# Patient Record
Sex: Male | Born: 1995 | Race: White | Hispanic: No | Marital: Single | Smoking: Never smoker
Health system: Southern US, Community
[De-identification: ages and names within clinical notes are randomized; demographics above are authoritative.]

## PROBLEM LIST (undated history)

## (undated) DIAGNOSIS — E109 Type 1 diabetes mellitus without complications: Secondary | ICD-10-CM

## (undated) HISTORY — PX: OTHER SURGICAL HISTORY: SHX169

---

## 2016-09-04 ENCOUNTER — Inpatient Hospital Stay
Admission: EM | Admit: 2016-09-04 | Discharge: 2016-09-07 | DRG: 638 | Disposition: A | Discharge status: Home / Self Care | Payer: Self-pay | Attending: Internal Medicine | Admitting: Internal Medicine

## 2016-09-04 ENCOUNTER — Encounter: Payer: Self-pay | Admitting: Emergency Medicine

## 2016-09-04 ENCOUNTER — Emergency Department: Payer: Self-pay

## 2016-09-04 DIAGNOSIS — E860 Dehydration: Secondary | ICD-10-CM | POA: Diagnosis present

## 2016-09-04 DIAGNOSIS — E1010 Type 1 diabetes mellitus with ketoacidosis without coma: Principal | ICD-10-CM | POA: Diagnosis present

## 2016-09-04 DIAGNOSIS — E1110 Type 2 diabetes mellitus with ketoacidosis without coma: Secondary | ICD-10-CM | POA: Diagnosis present

## 2016-09-04 DIAGNOSIS — N179 Acute kidney failure, unspecified: Secondary | ICD-10-CM | POA: Diagnosis present

## 2016-09-04 DIAGNOSIS — Z794 Long term (current) use of insulin: Secondary | ICD-10-CM

## 2016-09-04 DIAGNOSIS — Z833 Family history of diabetes mellitus: Secondary | ICD-10-CM

## 2016-09-04 DIAGNOSIS — E876 Hypokalemia: Secondary | ICD-10-CM | POA: Diagnosis not present

## 2016-09-04 HISTORY — DX: Type 1 diabetes mellitus without complications: E10.9

## 2016-09-04 LAB — BASIC METABOLIC PANEL
Anion gap: 21 — ABNORMAL HIGH (ref 5–15)
Anion gap: 15 (ref 5–15)
Anion gap: 14 (ref 5–15)
Anion gap: 12 (ref 5–15)
BUN: 11 mg/dL (ref 6–20)
BUN: 9 mg/dL (ref 6–20)
BUN: 8 mg/dL (ref 6–20)
BUN: 7 mg/dL (ref 6–20)
CHLORIDE: 111 mmol/L (ref 101–111)
CHLORIDE: 112 mmol/L — AB (ref 101–111)
CHLORIDE: 111 mmol/L (ref 101–111)
CO2: 8 mmol/L — ABNORMAL LOW (ref 22–32)
CO2: 13 mmol/L — ABNORMAL LOW (ref 22–32)
CO2: 14 mmol/L — ABNORMAL LOW (ref 22–32)
CO2: 15 mmol/L — AB (ref 22–32)
CREATININE: 1.17 mg/dL (ref 0.61–1.24)
CREATININE: 0.93 mg/dL (ref 0.61–1.24)
CREATININE: 0.97 mg/dL (ref 0.61–1.24)
CREATININE: 0.91 mg/dL (ref 0.61–1.24)
Calcium: 8.5 mg/dL — ABNORMAL LOW (ref 8.9–10.3)
Calcium: 8.4 mg/dL — ABNORMAL LOW (ref 8.9–10.3)
Calcium: 8.6 mg/dL — ABNORMAL LOW (ref 8.9–10.3)
Calcium: 8.5 mg/dL — ABNORMAL LOW (ref 8.9–10.3)
Chloride: 111 mmol/L (ref 101–111)
GFR calc Af Amer: >60 mL/min (ref >60)
GFR calc Af Amer: >60 mL/min (ref >60)
GFR calc Af Amer: >60 mL/min (ref >60)
GFR calc Af Amer: >60 mL/min (ref >60)
GFR calc non Af Amer: >60 mL/min (ref >60)
GFR calc non Af Amer: >60 mL/min (ref >60)
GLUCOSE: 252 mg/dL — AB (ref 65–99)
GLUCOSE: 277 mg/dL — AB (ref 65–99)
GLUCOSE: 146 mg/dL — AB (ref 65–99)
GLUCOSE: 152 mg/dL — AB (ref 65–99)
POTASSIUM: 3.3 mmol/L — AB (ref 3.5–5.1)
Potassium: 3.9 mmol/L (ref 3.5–5.1)
Potassium: 4.4 mmol/L (ref 3.5–5.1)
Potassium: 4 mmol/L (ref 3.5–5.1)
SODIUM: 140 mmol/L (ref 135–145)
SODIUM: 139 mmol/L (ref 135–145)
SODIUM: 140 mmol/L (ref 135–145)
SODIUM: 138 mmol/L (ref 135–145)

## 2016-09-04 LAB — COMPREHENSIVE METABOLIC PANEL
ALBUMIN: 4.4 g/dL (ref 3.5–5.0)
ALT: 49 U/L (ref 17–63)
ANION GAP: 26 — AB (ref 5–15)
AST: 47 U/L — ABNORMAL HIGH (ref 15–41)
Alkaline Phosphatase: 161 U/L — ABNORMAL HIGH (ref 38–126)
BUN: 13 mg/dL (ref 6–20)
CO2: 10 mmol/L — AB (ref 22–32)
Calcium: 10 mg/dL (ref 8.9–10.3)
Chloride: 100 mmol/L — ABNORMAL LOW (ref 101–111)
Creatinine, Ser: 1.33 mg/dL — ABNORMAL HIGH (ref 0.61–1.24)
GFR calc non Af Amer: >60 mL/min (ref >60)
GLUCOSE: 461 mg/dL — AB (ref 65–99)
POTASSIUM: 4.3 mmol/L (ref 3.5–5.1)
SODIUM: 136 mmol/L (ref 135–145)
TOTAL PROTEIN: 8 g/dL (ref 6.5–8.1)
Total Bilirubin: 2.5 mg/dL — ABNORMAL HIGH (ref 0.3–1.2)

## 2016-09-04 LAB — CBC WITH DIFFERENTIAL/PLATELET
BASOS PCT: 0 %
Basophils Absolute: 0 10*3/uL (ref 0–0.1)
EOS ABS: 0 10*3/uL (ref 0–0.7)
Eosinophils Relative: 0 %
HEMATOCRIT: 50.3 % (ref 40.0–52.0)
HEMOGLOBIN: 17.3 g/dL (ref 13.0–18.0)
LYMPHS ABS: 3.2 10*3/uL (ref 1.0–3.6)
Lymphocytes Relative: 34 %
MCH: 32.9 pg (ref 26.0–34.0)
MCHC: 34.3 g/dL (ref 32.0–36.0)
MCV: 95.8 fL (ref 80.0–100.0)
MONOS PCT: 6 %
Monocytes Absolute: 0.6 10*3/uL (ref 0.2–1.0)
NEUTROS ABS: 5.5 10*3/uL (ref 1.4–6.5)
NEUTROS PCT: 60 %
Platelets: 372 10*3/uL (ref 150–440)
RBC: 5.26 MIL/uL (ref 4.40–5.90)
RDW: 15.2 % — ABNORMAL HIGH (ref 11.5–14.5)
WBC: 9.3 10*3/uL (ref 3.8–10.6)

## 2016-09-04 LAB — GLUCOSE, CAPILLARY
GLUCOSE-CAPILLARY: 398 mg/dL — AB (ref 65–99)
GLUCOSE-CAPILLARY: 416 mg/dL — AB (ref 65–99)
GLUCOSE-CAPILLARY: 353 mg/dL — AB (ref 65–99)
GLUCOSE-CAPILLARY: 226 mg/dL — AB (ref 65–99)
GLUCOSE-CAPILLARY: 118 mg/dL — AB (ref 65–99)
GLUCOSE-CAPILLARY: 129 mg/dL — AB (ref 65–99)
GLUCOSE-CAPILLARY: 336 mg/dL — AB (ref 65–99)
GLUCOSE-CAPILLARY: 293 mg/dL — AB (ref 65–99)
GLUCOSE-CAPILLARY: 187 mg/dL — AB (ref 65–99)
GLUCOSE-CAPILLARY: 121 mg/dL — AB (ref 65–99)
GLUCOSE-CAPILLARY: 146 mg/dL — AB (ref 65–99)
Glucose-Capillary: 113 mg/dL — ABNORMAL HIGH (ref 65–99)
Glucose-Capillary: 114 mg/dL — ABNORMAL HIGH (ref 65–99)
Glucose-Capillary: 141 mg/dL — ABNORMAL HIGH (ref 65–99)
Glucose-Capillary: 151 mg/dL — ABNORMAL HIGH (ref 65–99)
Glucose-Capillary: 183 mg/dL — ABNORMAL HIGH (ref 65–99)
Glucose-Capillary: 234 mg/dL — ABNORMAL HIGH (ref 65–99)

## 2016-09-04 LAB — BETA-HYDROXYBUTYRIC ACID

## 2016-09-04 LAB — LACTIC ACID, PLASMA
LACTIC ACID, VENOUS: 2.8 mmol/L — AB (ref 0.5–1.9)
Lactic Acid, Venous: 4.6 mmol/L (ref 0.5–1.9)

## 2016-09-04 LAB — MRSA PCR SCREENING: MRSA by PCR: NEGATIVE

## 2016-09-04 LAB — TROPONIN I: Troponin I: <0.03 ng/mL (ref <0.03)

## 2016-09-04 MED ORDER — ENOXAPARIN SODIUM 40 MG/0.4ML ~~LOC~~ SOLN
40.0000 mg | SUBCUTANEOUS | Status: DC
Start: 2016-09-04 — End: 2016-09-07
  Administered 2016-09-04 – 2016-09-06 (×3): 40 mg via SUBCUTANEOUS
  Filled 2016-09-04 (×3): qty 0.4

## 2016-09-04 MED ORDER — SODIUM CHLORIDE 0.9 % IV SOLN
Freq: Once | INTRAVENOUS | Status: AC
  Administered 2016-09-04: 09:00:00 via INTRAVENOUS

## 2016-09-04 MED ORDER — ACETAMINOPHEN 650 MG RE SUPP
650.0000 mg | Freq: Four times a day (QID) | RECTAL | Status: DC | PRN
Start: 2016-09-04 — End: 2016-09-07

## 2016-09-04 MED ORDER — ONDANSETRON HCL 4 MG/2ML IJ SOLN
4.0000 mg | Freq: Once | INTRAMUSCULAR | Status: AC
  Administered 2016-09-04: 4 mg via INTRAVENOUS

## 2016-09-04 MED ORDER — POTASSIUM CHLORIDE 10 MEQ/100ML IV SOLN
10.0000 meq | Freq: Once | INTRAVENOUS | Status: AC
  Administered 2016-09-04: 10 meq via INTRAVENOUS
  Filled 2016-09-04: qty 100

## 2016-09-04 MED ORDER — SODIUM CHLORIDE 0.9 % IV SOLN: INTRAVENOUS | Status: AC

## 2016-09-04 MED ORDER — SODIUM CHLORIDE 0.9 % IV BOLUS (SEPSIS)
1000.0000 mL | Freq: Once | INTRAVENOUS | Status: AC
  Administered 2016-09-04: 1000 mL via INTRAVENOUS

## 2016-09-04 MED ORDER — SODIUM CHLORIDE 0.9% FLUSH
3.0000 mL | Freq: Two times a day (BID) | INTRAVENOUS | Status: DC
  Administered 2016-09-04 – 2016-09-05 (×2): 3 mL via INTRAVENOUS

## 2016-09-04 MED ORDER — ALBUTEROL SULFATE (2.5 MG/3ML) 0.083% IN NEBU: 2.5000 mg | INHALATION_SOLUTION | RESPIRATORY_TRACT | Status: DC | PRN

## 2016-09-04 MED ORDER — ONDANSETRON HCL 4 MG/2ML IJ SOLN
INTRAMUSCULAR | Status: AC
  Filled 2016-09-04: qty 2

## 2016-09-04 MED ORDER — POTASSIUM CHLORIDE 10 MEQ/100ML IV SOLN
10.0000 meq | INTRAVENOUS | Status: AC
  Administered 2016-09-04: 10 meq via INTRAVENOUS
  Filled 2016-09-04 (×2): qty 100

## 2016-09-04 MED ORDER — DEXTROSE-NACL 5-0.45 % IV SOLN
INTRAVENOUS | Status: DC
  Administered 2016-09-04 – 2016-09-05 (×3): via INTRAVENOUS

## 2016-09-04 MED ORDER — INSULIN REGULAR HUMAN 100 UNIT/ML IJ SOLN
INTRAMUSCULAR | Status: DC
  Administered 2016-09-04: 3.6 [IU]/h via INTRAVENOUS
  Administered 2016-09-05: 1.8 [IU]/h via INTRAVENOUS
  Administered 2016-09-05: 1.7 [IU]/h via INTRAVENOUS
  Administered 2016-09-05: 0.7 [IU]/h via INTRAVENOUS
  Administered 2016-09-05: 4.7 [IU]/h via INTRAVENOUS
  Administered 2016-09-05: 1.6 [IU]/h via INTRAVENOUS
  Administered 2016-09-05: 6 [IU]/h via INTRAVENOUS
  Administered 2016-09-05: 12:00:00 via INTRAVENOUS
  Administered 2016-09-05: 3.3 [IU]/h via INTRAVENOUS
  Administered 2016-09-05: 4.6 [IU]/h via INTRAVENOUS
  Administered 2016-09-05: 3.2 [IU]/h via INTRAVENOUS
  Administered 2016-09-05: 3.6 [IU]/h via INTRAVENOUS
  Administered 2016-09-06: 6.9 [IU]/h via INTRAVENOUS
  Administered 2016-09-06: 4.6 [IU]/h via INTRAVENOUS
  Administered 2016-09-06: 3.5 [IU]/h via INTRAVENOUS
  Filled 2016-09-04 (×2): qty 2.5

## 2016-09-04 MED ORDER — ONDANSETRON HCL 4 MG PO TABS: 4.0000 mg | ORAL_TABLET | Freq: Four times a day (QID) | ORAL | Status: DC | PRN

## 2016-09-04 MED ORDER — SODIUM CHLORIDE 0.9 % IV SOLN: INTRAVENOUS | Status: DC

## 2016-09-04 MED ORDER — BISACODYL 5 MG PO TBEC: 5.0000 mg | DELAYED_RELEASE_TABLET | Freq: Every day | ORAL | Status: DC | PRN

## 2016-09-04 MED ORDER — ONDANSETRON HCL 4 MG/2ML IJ SOLN: 4.0000 mg | Freq: Four times a day (QID) | INTRAMUSCULAR | Status: DC | PRN

## 2016-09-04 MED ORDER — POLYETHYLENE GLYCOL 3350 17 G PO PACK: 17.0000 g | PACK | Freq: Every day | ORAL | Status: DC | PRN

## 2016-09-04 MED ORDER — ACETAMINOPHEN 325 MG PO TABS: 650.0000 mg | ORAL_TABLET | Freq: Four times a day (QID) | ORAL | Status: DC | PRN

## 2016-09-04 MED ORDER — INSULIN REGULAR HUMAN 100 UNIT/ML IJ SOLN: INTRAMUSCULAR | Status: DC

## 2016-09-04 NOTE — ED Notes (Signed)
Dr Mayford Knifewilliams notified lactate 2.8

## 2016-09-04 NOTE — ED Notes (Signed)
Pt is very drowsy. Dr Mayford Knifewilliams notified. VSS at this time. Will continue to monitor

## 2016-09-04 NOTE — ED Triage Notes (Signed)
Pt is type I DM. Vomiting since yesterday. Unable to keep liquids down.

## 2016-09-04 NOTE — ED Notes (Signed)
Dr Mayford Knifewilliams notified ph 7.14 HCO3 8.9

## 2016-09-04 NOTE — ED Provider Notes (Signed)
[facility] Emergency Department Provider Note        Time seen: ----------------------------------------- 8:23 AM on 09/04/2016 -----------------------------------------    I have reviewed the triage vital signs and the nursing notes.   HISTORY  Chief Complaint No chief complaint on file.    HPI Stephen Curry is a 20 y.o. male who presents to the ERfor vomiting since yesterday. He's been unable to keep any liquids down, has had a cough also, complains of diffuse body pain. He denies fevers, chills, chest pain, diarrhea or other complaints. He does have a history of DKA, took his morning insulin 11 units at 6 AM. He also took Lantus last night as prescribed. Patient denies any other recent illness or changes in his medicines. He reports he has been compliant.   No past medical history on file.  There are no active problems to display for this patient.   No past surgical history on file.  Allergies Review of patient's allergies indicates not on file.  Social History Social History  Substance Use Topics  . Smoking status: Not on file  . Smokeless tobacco: Not on file  . Alcohol use Not on file    Review of Systems Constitutional: Negative for fever. Cardiovascular: Negative for chest pain. Respiratory: Positive for shortness of breath Gastrointestinal: Positive for abdominal pain, vomiting Genitourinary: Negative for dysuria. Musculoskeletal: Negative for back pain. Positive for diffuse myalgias Skin: Negative for rash. Neurological: Negative for headaches, positive for weakness  10-point ROS otherwise negative.  ____________________________________________   PHYSICAL EXAM:  VITAL SIGNS: ED Triage Vitals  Enc Vitals Group     BP 09/04/16 0811 119/75     Pulse Rate 09/04/16 0811 (!) 144     Resp 09/04/16 0811 18     Temp 09/04/16 0811 98.5 F (36.9 C)     Temp Source 09/04/16 0811 Oral     SpO2 09/04/16 0811 97 %     Weight  09/04/16 0812 160 lb (72.6 kg)     Height 09/04/16 0812 5\' 11"  (1.803 m)     Head Circumference --      Peak Flow --      Pain Score --      Pain Loc --      Pain Edu? --      Excl. in GC? --     Constitutional: Alert and oriented. Mild to moderate distress Eyes: Conjunctivae are normal. PERRL. Normal extraocular movements. ENT   Head: Normocephalic and atraumatic.   Nose: No congestion/rhinnorhea.   Mouth/Throat: Mucous membranes are dry   Neck: No stridor. Cardiovascular: Rapid rate, regular rhythm. No murmurs, rubs, or gallops. Respiratory: Tachypnea with clear breath sounds Gastrointestinal: Soft and nontender. Normal bowel sounds Musculoskeletal: Nontender with normal range of motion in all extremities. No lower extremity tenderness nor edema. Neurologic:  Normal speech and language. Generalized weakness, nothing focal Skin:  Skin is warm, dry and intact. No rash noted. Psychiatric: Mood and affect are normal. Speech and behavior are normal.  ____________________________________________  EKG: Interpreted by me. Sinus tachycardia with a rate of 148 bpm, normal PR interval, normal QRS, long QT, junctional ST depression  ____________________________________________  ED COURSE:  Pertinent labs & imaging results that were available during my care of the patient were reviewed by me and considered in my medical decision making (see chart for details). Clinical Course  Patient presents to ER likely in DKA. We will assess with labs, give IV fluids and start insulin infusion. CRITICAL CARE  Performed by: Emily FilbertWilliams, Jenna Ardoin E   Total critical care time: 30 minutes  Critical care time was exclusive of separately billable procedures and treating other patients.  Critical care was necessary to treat or prevent imminent or life-threatening deterioration.  Critical care was time spent personally by me on the following activities: development of treatment plan with patient  and/or surrogate as well as nursing, discussions with consultants, evaluation of patient's response to treatment, examination of patient, obtaining history from patient or surrogate, ordering and performing treatments and interventions, ordering and review of laboratory studies, ordering and review of radiographic studies, pulse oximetry and re-evaluation of patient's condition.   Procedures ____________________________________________   LABS (pertinent positives/negatives)  Labs Reviewed  GLUCOSE, CAPILLARY - Abnormal; Notable for the following:       Result Value   Glucose-Capillary 398 (*)    All other components within normal limits  LACTIC ACID, PLASMA - Abnormal; Notable for the following:    Lactic Acid, Venous 2.8 (*)    All other components within normal limits  COMPREHENSIVE METABOLIC PANEL - Abnormal; Notable for the following:    Chloride 100 (*)    CO2 10 (*)    Glucose, Bld 461 (*)    Creatinine, Ser 1.33 (*)    AST 47 (*)    Alkaline Phosphatase 161 (*)    Total Bilirubin 2.5 (*)    Anion gap 26 (*)    All other components within normal limits  CBC WITH DIFFERENTIAL/PLATELET - Abnormal; Notable for the following:    RDW 15.2 (*)    All other components within normal limits  BLOOD GAS, VENOUS - Abnormal; Notable for the following:    pH, Ven 7.14 (*)    pCO2, Ven 26 (*)    Bicarbonate 8.9 (*)    Acid-base deficit 18.6 (*)    All other components within normal limits  BETA-HYDROXYBUTYRIC ACID - Abnormal; Notable for the following:    Beta-Hydroxybutyric Acid >8.00 (*)    All other components within normal limits  GLUCOSE, CAPILLARY - Abnormal; Notable for the following:    Glucose-Capillary 416 (*)    All other components within normal limits  TROPONIN I  LACTIC ACID, PLASMA  URINALYSIS COMPLETEWITH MICROSCOPIC ([facility] ONLY)    RADIOLOGY  Chest xray IMPRESSION: No active disease.  ____________________________________________  FINAL ASSESSMENT AND  PLAN  Diabetic Ketoacidosis  Plan: Patient with labs and imaging as dictated above. Patient presented to the ER with vomiting, diffuse pain and hyperglycemia. He does have a high anion gap metabolic acidosis, elevated beta hydroxybutyrate and lactate level. He's received IV fluids, is currently on insulin drip. He still remains lethargic but cooperative. I will discuss with the hospitalist for admission.   Emily FilbertWilliams, Nikos Anglemyer E, MD   Note: This dictation was prepared with Dragon dictation. Any transcriptional errors that result from this process are unintentional    Emily FilbertJonathan E Naszir Cott, MD 09/04/16 [phone]

## 2016-09-04 NOTE — ED Notes (Signed)
Pt aware of need for urine specimen. 

## 2016-09-04 NOTE — H&P (Signed)
[facility] [place] at [facility]   PATIENT NAME: Stephen Curry    MR#:  [id number]  DATE OF BIRTH:  1996-02-26  DATE OF ADMISSION:  09/04/2016  PRIMARY CARE PHYSICIAN: No primary care provider on file.   REQUESTING/REFERRING PHYSICIAN: Dr. Mayford Knife  CHIEF COMPLAINT:   Chief Complaint  Patient presents with  . Emesis    HISTORY OF PRESENT ILLNESS:  Stephen Curry  is a 20 y.o. male with a known history of Type 1 diabetes mellitus, DKA presents to the emergency room complaining of 2 days of vomiting. Did not miss any insulin. Here in the emergency room his blood sugars are 500 and bicarbonate of 10. Anion gap 20. Patient is being admitted for DKA being critically ill.  PAST MEDICAL HISTORY:   Past Medical History:  Diagnosis Date  . Type 1 diabetes (HCC)     PAST SURGICAL HISTORY:  History reviewed. No pertinent surgical history.  SOCIAL HISTORY:   Social History  Substance Use Topics  . Smoking status: Never Smoker  . Smokeless tobacco: Not on file  . Alcohol use No    FAMILY HISTORY:   Family History  Problem Relation Age of Onset  . Diabetes Other     DRUG ALLERGIES:  No Known Allergies  REVIEW OF SYSTEMS:   ROS  MEDICATIONS AT HOME:   Prior to Admission medications   Not on File     VITAL SIGNS:  Blood pressure (!) 134/95, pulse (!) 101, temperature 98.5 F (36.9 C), temperature source Oral, resp. rate 17, height 5\' 11"  (1.803 m), weight 72.6 kg (160 lb), SpO2 100 %.  PHYSICAL EXAMINATION:  Physical Exam  GENERAL:  20 y.o.-year-old patient lying in the bed. Looks critically ill EYES: Pupils equal, round, reactive to light and accommodation. No scleral icterus. Extraocular muscles intact.  HEENT: Head atraumatic, normocephalic. Oropharynx and nasopharynx clear. No oropharyngeal erythema, dry oral mucosa  NECK:  Supple, no jugular venous distention. No thyroid enlargement, no tenderness.  LUNGS: Normal breath sounds bilaterally,  no wheezing, rales, rhonchi. No use of accessory muscles of respiration.  CARDIOVASCULAR: S1, S2 normal. No murmurs, rubs, or gallops. Tachycardic ABDOMEN: Soft, nontender, nondistended. Bowel sounds present. No organomegaly or mass.  EXTREMITIES: No pedal edema, cyanosis, or clubbing. + 2 pedal & radial pulses b/l.   NEUROLOGIC: Cranial nerves II through XII are intact. No focal Motor or sensory deficits appreciated b/l PSYCHIATRIC: The patient is alert and oriented x 3. Good affect.  SKIN: No obvious rash, lesion, or ulcer.   LABORATORY PANEL:   CBC  Recent Labs Lab 09/04/16 0827  WBC 9.3  HGB 17.3  HCT 50.3  PLT 372   ------------------------------------------------------------------------------------------------------------------  Chemistries   Recent Labs Lab 09/04/16 0827  NA 136  K 4.3  CL 100*  CO2 10*  GLUCOSE 461*  BUN 13  CREATININE 1.33*  CALCIUM 10.0  AST 47*  ALT 49  ALKPHOS 161*  BILITOT 2.5*   ------------------------------------------------------------------------------------------------------------------  Cardiac Enzymes  Recent Labs Lab 09/04/16 0827  TROPONINI <0.03   ------------------------------------------------------------------------------------------------------------------  RADIOLOGY:  Dg Chest Port 1 View  Result Date: 09/04/2016 CLINICAL DATA:  Vomiting, cough for 2 days, diabetic, DKA EXAM: PORTABLE CHEST 1 VIEW COMPARISON:  None. FINDINGS: The heart size and mediastinal contours are within normal limits. Both lungs are clear. The visualized skeletal structures are unremarkable. IMPRESSION: No active disease. Electronically Signed   By: Bary Richard M.D.   On: 09/04/2016 09:07     IMPRESSION AND  PLAN:   * Diabetic ketoacidosis Admit to ICU. DKA protocol. Accu-Cheks every hour. BMP every 4 hours. Bolus normal saline. Change to D5 normal saline and blood sugars less than 250. Patient takes 40 units Lantus at home and can be  transitioned once DKA resolves. Check HbA1c.  * Acute kidney injury due to dehydration. Should improve with fluid resuscitation.  All the records are reviewed and case discussed with ED provider. Management plans discussed with the patient, family and they are in agreement.  CODE STATUS: FULL CODE  TOTAL CC TIME TAKING CARE OF THIS PATIENT: 40 minutes.   Milagros LollSudini, Lazar Tierce R M.D on 09/04/2016 at 10:17 AM  Between 7am to 6pm - Pager - [phone]  After 6pm go to [email or web address] - password [email or web address]  [facility] [place] [facility]  Office  [phone]  CC: Primary care physician; No primary care provider on file.  Note: This dictation was prepared with Dragon dictation along with smaller phrase technology. Any transcriptional errors that result from this process are unintentional.

## 2016-09-05 LAB — BASIC METABOLIC PANEL
ANION GAP: 11 (ref 5–15)
ANION GAP: 9 (ref 5–15)
ANION GAP: 9 (ref 5–15)
Anion gap: 14 (ref 5–15)
Anion gap: 11 (ref 5–15)
Anion gap: 8 (ref 5–15)
BUN: 7 mg/dL (ref 6–20)
BUN: 6 mg/dL (ref 6–20)
BUN: 5 mg/dL — ABNORMAL LOW (ref 6–20)
BUN: 6 mg/dL (ref 6–20)
BUN: 9 mg/dL (ref 6–20)
BUN: 14 mg/dL (ref 6–20)
CALCIUM: 8.8 mg/dL — AB (ref 8.9–10.3)
CALCIUM: 8.7 mg/dL — AB (ref 8.9–10.3)
CHLORIDE: 109 mmol/L (ref 101–111)
CHLORIDE: 108 mmol/L (ref 101–111)
CHLORIDE: 111 mmol/L (ref 101–111)
CHLORIDE: 110 mmol/L (ref 101–111)
CHLORIDE: 109 mmol/L (ref 101–111)
CO2: 13 mmol/L — ABNORMAL LOW (ref 22–32)
CO2: 14 mmol/L — ABNORMAL LOW (ref 22–32)
CO2: 18 mmol/L — ABNORMAL LOW (ref 22–32)
CO2: 18 mmol/L — ABNORMAL LOW (ref 22–32)
CO2: 18 mmol/L — ABNORMAL LOW (ref 22–32)
CO2: 20 mmol/L — ABNORMAL LOW (ref 22–32)
CREATININE: 0.9 mg/dL (ref 0.61–1.24)
CREATININE: 0.94 mg/dL (ref 0.61–1.24)
CREATININE: 0.83 mg/dL (ref 0.61–1.24)
CREATININE: 0.87 mg/dL (ref 0.61–1.24)
CREATININE: 0.81 mg/dL (ref 0.61–1.24)
Calcium: 8.6 mg/dL — ABNORMAL LOW (ref 8.9–10.3)
Calcium: 8.4 mg/dL — ABNORMAL LOW (ref 8.9–10.3)
Calcium: 8.6 mg/dL — ABNORMAL LOW (ref 8.9–10.3)
Calcium: 8.7 mg/dL — ABNORMAL LOW (ref 8.9–10.3)
Chloride: 110 mmol/L (ref 101–111)
Creatinine, Ser: 0.9 mg/dL (ref 0.61–1.24)
GFR calc Af Amer: >60 mL/min (ref >60)
GFR calc non Af Amer: >60 mL/min (ref >60)
GFR calc non Af Amer: >60 mL/min (ref >60)
GFR calc non Af Amer: >60 mL/min (ref >60)
GFR calc non Af Amer: >60 mL/min (ref >60)
GFR calc non Af Amer: >60 mL/min (ref >60)
GFR calc non Af Amer: >60 mL/min (ref >60)
Glucose, Bld: 245 mg/dL — ABNORMAL HIGH (ref 65–99)
Glucose, Bld: 232 mg/dL — ABNORMAL HIGH (ref 65–99)
Glucose, Bld: 318 mg/dL — ABNORMAL HIGH (ref 65–99)
Glucose, Bld: 218 mg/dL — ABNORMAL HIGH (ref 65–99)
Glucose, Bld: 141 mg/dL — ABNORMAL HIGH (ref 65–99)
Glucose, Bld: 125 mg/dL — ABNORMAL HIGH (ref 65–99)
POTASSIUM: 3.1 mmol/L — AB (ref 3.5–5.1)
POTASSIUM: 3.8 mmol/L (ref 3.5–5.1)
POTASSIUM: 4.1 mmol/L (ref 3.5–5.1)
Potassium: 3.3 mmol/L — ABNORMAL LOW (ref 3.5–5.1)
Potassium: 4.1 mmol/L (ref 3.5–5.1)
Potassium: 4 mmol/L (ref 3.5–5.1)
SODIUM: 136 mmol/L (ref 135–145)
SODIUM: 137 mmol/L (ref 135–145)
SODIUM: 138 mmol/L (ref 135–145)
SODIUM: 137 mmol/L (ref 135–145)
SODIUM: 137 mmol/L (ref 135–145)
Sodium: 135 mmol/L (ref 135–145)

## 2016-09-05 LAB — GLUCOSE, CAPILLARY
GLUCOSE-CAPILLARY: 265 mg/dL — AB (ref 65–99)
GLUCOSE-CAPILLARY: 155 mg/dL — AB (ref 65–99)
GLUCOSE-CAPILLARY: 121 mg/dL — AB (ref 65–99)
GLUCOSE-CAPILLARY: 120 mg/dL — AB (ref 65–99)
GLUCOSE-CAPILLARY: 231 mg/dL — AB (ref 65–99)
GLUCOSE-CAPILLARY: 215 mg/dL — AB (ref 65–99)
GLUCOSE-CAPILLARY: 141 mg/dL — AB (ref 65–99)
GLUCOSE-CAPILLARY: 84 mg/dL (ref 65–99)
GLUCOSE-CAPILLARY: 224 mg/dL — AB (ref 65–99)
GLUCOSE-CAPILLARY: 130 mg/dL — AB (ref 65–99)
GLUCOSE-CAPILLARY: 221 mg/dL — AB (ref 65–99)
GLUCOSE-CAPILLARY: 211 mg/dL — AB (ref 65–99)
GLUCOSE-CAPILLARY: 116 mg/dL — AB (ref 65–99)
GLUCOSE-CAPILLARY: 150 mg/dL — AB (ref 65–99)
Glucose-Capillary: 133 mg/dL — ABNORMAL HIGH (ref 65–99)
Glucose-Capillary: 235 mg/dL — ABNORMAL HIGH (ref 65–99)
Glucose-Capillary: 217 mg/dL — ABNORMAL HIGH (ref 65–99)
Glucose-Capillary: 294 mg/dL — ABNORMAL HIGH (ref 65–99)
Glucose-Capillary: 157 mg/dL — ABNORMAL HIGH (ref 65–99)
Glucose-Capillary: 177 mg/dL — ABNORMAL HIGH (ref 65–99)
Glucose-Capillary: 142 mg/dL — ABNORMAL HIGH (ref 65–99)
Glucose-Capillary: 212 mg/dL — ABNORMAL HIGH (ref 65–99)

## 2016-09-05 LAB — HEMOGLOBIN A1C
Hgb A1c MFr Bld: 11.9 % — ABNORMAL HIGH (ref 4.8–5.6)
Mean Plasma Glucose: 295 mg/dL

## 2016-09-05 LAB — CBC
HEMATOCRIT: 41.5 % (ref 40.0–52.0)
HEMOGLOBIN: 14.3 g/dL (ref 13.0–18.0)
MCH: 32.6 pg (ref 26.0–34.0)
MCHC: 34.4 g/dL (ref 32.0–36.0)
MCV: 94.9 fL (ref 80.0–100.0)
PLATELETS: 220 10*3/uL (ref 150–440)
RBC: 4.37 MIL/uL — AB (ref 4.40–5.90)
RDW: 14.7 % — ABNORMAL HIGH (ref 11.5–14.5)
WBC: 5.1 10*3/uL (ref 3.8–10.6)

## 2016-09-05 LAB — LACTIC ACID, PLASMA
LACTIC ACID, VENOUS: 6.3 mmol/L — AB (ref 0.5–1.9)
Lactic Acid, Venous: 1.1 mmol/L (ref 0.5–1.9)

## 2016-09-05 LAB — MAGNESIUM: Magnesium: 1.5 mg/dL — ABNORMAL LOW (ref 1.7–2.4)

## 2016-09-05 MED ORDER — INSULIN REGULAR BOLUS VIA INFUSION
0.0000 [IU] | Freq: Three times a day (TID) | INTRAVENOUS | Status: DC
  Administered 2016-09-05: 3.8 [IU] via INTRAVENOUS
  Administered 2016-09-05: 6.1 [IU] via INTRAVENOUS
  Filled 2016-09-05: qty 10

## 2016-09-05 MED ORDER — MAGNESIUM SULFATE 2 GM/50ML IV SOLN
2.0000 g | Freq: Once | INTRAVENOUS | Status: AC
  Administered 2016-09-05: 2 g via INTRAVENOUS
  Filled 2016-09-05: qty 50

## 2016-09-05 MED ORDER — POTASSIUM CHLORIDE CRYS ER 20 MEQ PO TBCR
40.0000 meq | EXTENDED_RELEASE_TABLET | Freq: Once | ORAL | Status: AC
  Administered 2016-09-05: 40 meq via ORAL
  Filled 2016-09-05: qty 2

## 2016-09-05 MED ORDER — KCL IN DEXTROSE-NACL 20-5-0.45 MEQ/L-%-% IV SOLN
INTRAVENOUS | Status: DC
  Administered 2016-09-05: 04:00:00 via INTRAVENOUS
  Administered 2016-09-05: 1000 mL via INTRAVENOUS
  Administered 2016-09-05: 150 mL/h via INTRAVENOUS
  Administered 2016-09-06: 150 mL via INTRAVENOUS
  Filled 2016-09-05 (×9): qty 1000

## 2016-09-05 MED ORDER — POTASSIUM CHLORIDE 20 MEQ PO PACK
40.0000 meq | PACK | Freq: Two times a day (BID) | ORAL | Status: DC
  Administered 2016-09-05: 40 meq via ORAL
  Filled 2016-09-05: qty 2

## 2016-09-05 NOTE — Progress Notes (Deleted)
Dr Clapacs with pt  

## 2016-09-05 NOTE — Progress Notes (Signed)
Chaplain rounded the unit to provide a compassionate presence and support for the patient. Patient shook and nodded head until Chaplain asked do you speak. To which the patient replied yes and started talking.Stephen PettyChaplain Aleece Loyd [phone]

## 2016-09-05 NOTE — Progress Notes (Signed)
MEDICATION RELATED CONSULT NOTE - INITIAL   Pharmacy Consult for Electrolyte Monitoring Indication: Insulin Drip  No Known Allergies  Patient Measurements: Height: 5\' 11"  (180.3 cm) Weight: 155 lb 13.8 oz (70.7 kg) IBW/kg (Calculated) : 75.3  Vital Signs: Temp: 98.2 F (36.8 C) (10/16 0931) Temp Source: Oral (10/16 0931) BP: 106/76 (10/16 0931) Pulse Rate: 83 (10/16 0931) Intake/Output from previous day: 10/15 0701 - 10/16 0700 In: 6102.5 [I.V.:6002.5; IV Piggyback:100] Out: 1650 [Urine:1650] Intake/Output from this shift: Total I/O In: 240 [P.O.:240] Out: 900 [Urine:900]  Labs:  Recent Labs  09/04/16 0827  09/05/16 0311 09/05/16 0801 09/05/16 1209  WBC 9.3  --  5.1  --   --   HGB 17.3  --  14.3  --   --   HCT 50.3  --  41.5  --   --   PLT 372  --  220  --   --   CREATININE 1.33*  < > 0.90 0.90 0.94  ALBUMIN 4.4  --   --   --   --   PROT 8.0  --   --   --   --   AST 47*  --   --   --   --   ALT 49  --   --   --   --   ALKPHOS 161*  --   --   --   --   BILITOT 2.5*  --   --   --   --   < > = values in this interval not displayed. Estimated Creatinine Clearance: 124.3 mL/min (by C-G formula based on SCr of 0.94 mg/dL).   Microbiology: Recent Results (from the past 720 hour(s))  MRSA PCR Screening     Status: None   Collection Time: 09/04/16 11:46 AM  Result Value Ref Range Status   MRSA by PCR NEGATIVE NEGATIVE Final    Comment:        The GeneXpert MRSA Assay (FDA approved for NASAL specimens only), is one component of a comprehensive MRSA colonization surveillance program. It is not intended to diagnose MRSA infection nor to guide or monitor treatment for MRSA infections.     Medical History: Past Medical History:  Diagnosis Date  . Type 1 diabetes [facility](HCC)     Assessment: 20 y/o M with DKA on insulin drip. Pharmacy consulted to assist in managing electrolytes/K.  Goal:  K > or = 4.5  Plan:  K replaced this AM. Will f/u serial K while on  insulin drip. Next at 1500.   Luisa Harthristy, Dellar Traber D 09/05/2016,3:44 PM

## 2016-09-05 NOTE — Care Management (Signed)
Patient admitted from home in DKA.  Rep[place] in [place].  [place], however has not pursed applying for Medicaid in [place].  Patient has been sharing insulin with his father.  I have spoke with Medication  Management, and they have Novolog, humulin 70/30 pens, Novolin 70/30, and Novlin N in stock to provide to the patient at discharge.  I have provided Hilda LiasMarie the DM coordinator with this information, in order to make discharge recommendations.  Patient will need to be provided application to Medication Management and [facility].

## 2016-09-05 NOTE — Progress Notes (Signed)
Chaplain was making his rounds and entered room 14. Provided emotional support and the ministry of Prayer.    09/05/16 1015  Clinical Encounter Type  Visited With Patient  Visit Type Initial;Spiritual support  Referral From Nurse  Spiritual Encounters  Spiritual Needs Prayer;Emotional

## 2016-09-05 NOTE — Progress Notes (Signed)
lactid acid 6.3. Dr Amado CoeGouru notified no new order received

## 2016-09-05 NOTE — Progress Notes (Signed)
Patient remains alert and oriented, lying in the bed, denies pain, patient on full liquid diet, tolerating, remains on insulin drip at this time.

## 2016-09-05 NOTE — Progress Notes (Addendum)
[facility] [place] at [facility]   PATIENT NAME: Stephen Curry    MR#:  [id number]  DATE OF BIRTH:  [birth year]  SUBJECTIVE:  CHIEF COMPLAINT:  Patient's nausea and vomiting are resolved. Denies any diarrhea or abdominal pain. Hungry and wants to eat. [place]  REVIEW OF SYSTEMS:  CONSTITUTIONAL: No fever, fatigue or weakness.  EYES: No blurred or double vision.  EARS, NOSE, AND THROAT: No tinnitus or ear pain.  RESPIRATORY: No cough, shortness of breath, wheezing or hemoptysis.  CARDIOVASCULAR: No chest pain, orthopnea, edema.  GASTROINTESTINAL: No nausea, vomiting, diarrhea or abdominal pain.  GENITOURINARY: No dysuria, hematuria.  ENDOCRINE: No polyuria, nocturia,  HEMATOLOGY: No anemia, easy bruising or bleeding SKIN: No rash or lesion. MUSCULOSKELETAL: No joint pain or arthritis.   NEUROLOGIC: No tingling, numbness, weakness.  PSYCHIATRY: No anxiety or depression.   DRUG ALLERGIES:  No Known Allergies  VITALS:  Blood pressure 106/76, pulse 83, temperature 98.2 F (36.8 C), temperature source Oral, resp. rate 18, height 5\' 11"  (1.803 m), weight 70.7 kg (155 lb 13.8 oz), SpO2 99 %.  PHYSICAL EXAMINATION:  GENERAL:  20 y.o.-year-old patient lying in the bed with no acute distress.  EYES: Pupils equal, round, reactive to light and accommodation. No scleral icterus. Extraocular muscles intact.  HEENT: Head atraumatic, normocephalic. Oropharynx and nasopharynx clear.  NECK:  Supple, no jugular venous distention. No thyroid enlargement, no tenderness.  LUNGS: Normal breath sounds bilaterally, no wheezing, rales,rhonchi or crepitation. No use of accessory muscles of respiration.  CARDIOVASCULAR: S1, S2 normal. No murmurs, rubs, or gallops.  ABDOMEN: Soft, nontender, nondistended. Bowel sounds present. No organomegaly or mass.  EXTREMITIES: No pedal edema, cyanosis, or clubbing.  NEUROLOGIC: Cranial  nerves II through XII are intact. Muscle strength 5/5 in all extremities. Sensation intact. Gait not checked.  PSYCHIATRIC: The patient is alert and oriented x 3.  SKIN: No obvious rash, lesion, or ulcer.    LABORATORY PANEL:   CBC  Recent Labs Lab 09/05/16 0311  WBC 5.1  HGB 14.3  HCT 41.5  PLT 220   ------------------------------------------------------------------------------------------------------------------  Chemistries   Recent Labs Lab 09/04/16 0827  09/05/16 1209  NA 136  < > 137  K 4.3  < > 3.8  CL 100*  < > 108  CO2 10*  < > 18*  GLUCOSE 461*  < > 318*  BUN 13  < > 5*  CREATININE 1.33*  < > 0.94  CALCIUM 10.0  < > 8.6*  AST 47*  --   --   ALT 49  --   --   ALKPHOS 161*  --   --   BILITOT 2.5*  --   --   < > = values in this interval not displayed. ------------------------------------------------------------------------------------------------------------------  Cardiac Enzymes  Recent Labs Lab 09/04/16 0827  TROPONINI <0.03   ------------------------------------------------------------------------------------------------------------------  RADIOLOGY:  Dg Chest Port 1 View  Result Date: 09/04/2016 CLINICAL DATA:  Vomiting, cough for 2 days, diabetic, DKA EXAM: PORTABLE CHEST 1 VIEW COMPARISON:  None. FINDINGS: The heart size and mediastinal contours are within normal limits. Both lungs are clear. The visualized skeletal structures are unremarkable. IMPRESSION: No active disease. Electronically Signed   By: Bary Richard M.D.   On: 09/04/2016 09:07    EKG:   Orders placed or performed during the hospital encounter of 09/04/16  . EKG 12-Lead  . EKG 12-Lead  . ED EKG 12-Lead  .  ED EKG 12-Lead    ASSESSMENT AND PLAN:   * Diabetic ketoacidosis Clinically improvingBut still acidotic Anion gap closed but bicarbonate is still not in the normal range Continue IV fluids and insulin drip We will change him to Lantus once bicarbonate is in the  normal range  Accu-Cheks every hour. BMP every 4 hours. Patient takes 40 units Lantus at home and can be transitioned to Novolin 70/30 15 units twice a day once DKA resolves as his insurance is not covering Lantus anymore   HbA1c -11.6 Outpatient follow-up with endocrinology is recommended Lactic acid level is trending down Appreciate diabetic coordinator recommendations  * Acute kidney injury due to dehydration from nausea and vomiting. Should improve with fluid resuscitation  *Hypokalemia replete potassium and check a.m. labs including BMP and magnesium    All the records are reviewed and case discussed with Care Management/Social Workerr. Management plans discussed with the patient, family and they are in agreement.  CODE STATUS: fc   TOTAL  Critical care TIME TAKING CARE OF THIS PATIENT: 41 minutes.   POSSIBLE D/C IN 1-2 DAYS, DEPENDING ON CLINICAL CONDITION.  Note: This dictation was prepared with Dragon dictation along with smaller phrase technology. Any transcriptional errors that result from this process are unintentional.   Ramonita LabGouru, Haifa Hatton M.D on 09/05/2016 at 3:02 PM  Between 7am to 6pm - Pager - [phone] After 6pm go to [email or web address] - password [email or web address] [facility]  [place] [place] [facility]  Office  [phone]  CC: Primary care physician; No primary care provider on file.

## 2016-09-05 NOTE — Progress Notes (Signed)
Inpatient Diabetes Program Recommendations  AACE/ADA: New Consensus Statement on Inpatient Glycemic Control (2015)  Target Ranges:  Prepandial:   less than 140 mg/dL      Peak postprandial:   less than 180 mg/dL (1-2 hours)      Critically ill patients:  140 - 180 mg/dL  Results for Stephen Curry, Stephen Curry (MRN [id number]) as of 09/05/2016 14:32  Ref. Range 09/05/2016 06:52 09/05/2016 08:06 09/05/2016 08:58 09/05/2016 10:01 09/05/2016 11:08 09/05/2016 12:04 09/05/2016 13:05 09/05/2016 14:07  Glucose-Capillary Latest Ref Range: 65 - 99 mg/dL 244231 (H) 010215 (H) 272141 (H) 84 217 (H) 294 (H) 224 (H) 157 (H)  Results for Stephen Curry, Stephen Curry (MRN [id number]) as of 09/05/2016 14:32  Ref. Range 09/05/2016 12:09  CO2 Latest Ref Range: 22 - 32 mmol/L 18 (L)  Results for Stephen Curry, Stephen Curry (MRN [id number]) as of 09/05/2016 14:32  Ref. Range 09/05/2016 12:09  Anion gap Latest Ref Range: 5 - 15  11   Results for Stephen Curry, Stephen Curry (MRN [id number]) as of 09/05/2016 14:32  Ref. Range 09/04/2016 11:23  Hemoglobin A1C Latest Ref Range: 4.8 - 5.6 % 11.9 (H)   Review of Glycemic Control  Diabetes history: DM1 Outpatient Diabetes medications: Lantus 40 units QHS, Novolog TID with meals (covered carbohydrates with 1 unit for every 10 grams of carbs; and correction with 1 unit for every 30 mg/dl above target of 188120 mg/dl Current orders for Inpatient glycemic control: Novolin R insulin drip  Inpatient Diabetes Program Recommendations: Insulin - IV drip/GlucoStabilizer: Patient is currently on an insulin drip and requiring IV insulin per GlucoStabilizer. According to labs at 12:09 today, patient remains acidotic and still needs IV insulin until acidosis clears (once CO2 is 20 or greater and Anion Gap is 10-12 or less).  IV fluids: If patient is tolerating PO intake, please consider decreasing IV fluids with dextrose (if appropriate). Once patient is ready to transition from IV to SQ insulin, please discontinue dextrose in IV fluids. Insulin -  Basal: Once acidosis is cleared and MD orders for patient to transition from IV to SQ insulin, please consider ordering 70/30 15 units BID (which will provide 21 units for basal and 9 units for MC per day). Correction (SSI): Once acidosis is cleared and MD orders for patient to transition from IV to SQ insulin, please consider ordering Novolog 0-9 units Q4H (to provide closer glucose monitoring after transitioned off IV insulin). Insulin - Meal Coverage: Would not recommend ordering meal coverage if 70/30 insulin is ordered as recommended (since 70/30 provides 70% for basal and 30% for meal coverage)  NOTE: Spoke with patient and his parents about diabetes and home regimen for diabetes control. Of note, patient has a very flat affect but seems to have a good basic understanding of diabetes.  Patient reports that he was diagnosed with Type 1 diabetes 7 years ago. Patient's father has Type 2 diabetes and uses insulin.  [place] moved [place] to [place] at the end of July 2017. Patient reports that he had Medicaid in [place] and was followed by Dr. Modena Janskyavaee (Endocrinologist) in [place] and he last saw Dr. Modena Janskyavaee in July [place]. Patient states that he was using Lantus and Humalog for DM control but he ran out of insulin that he had when he came to [place] so he began using his father's insulin pens (Lantus and Novolog).  Patient currently has NO insurance and has not went [place] [facility] [place]. Patient has been taking Lantus 40 units at bedtime and Novolog  TID with meals (1 unit for every 10 grams of carbs and 1 unit for every 30 mg/dl above target glucose of 120 mg/dl). Patient will need prescription for insulin at time of discharge and will need assistance with getting insulin prescription filled at discharge. Patient has not seen any doctor since moving to [facility].  Discussed DKA and inquired about prior DKA. Patient reports that he has had DKA 3 times since January (once in January and March due to  having the flu) and now. Patient states that he is not sure but feels he may have gotten a stomach virus because he started vomiting on Saturday prior to having any issues with hyperglycemia. Discussed DKA process and clarified questions about DKA disease process for patient and his father. Also discussed DKA protocol and parameters to transition off IV insulin to SQ insulin.  Inquired about prior A1C and patient reports that he does not recall his last A1C value. Informed patient that A1C had been ordered but was not resulted in chart at time of visit.  Discussed glucose and A1C goals. Discussed importance of checking CBGs and maintaining good CBG control to prevent long-term and short-term complications. Explained how hyperglycemia leads to damage within blood vessels which lead to the common complications seen with uncontrolled diabetes. Stressed to the patient the importance of improving glycemic control to prevent further complications from uncontrolled diabetes. Discussed impact of nutrition, exercise, stress, sickness, and medications on diabetes control. [facility]ed [facility]. Informed patient that CM and SW would be consulted to see if they could assist patient with follow up, getting insulin at time of discharge, and will assistance to being process for applying for Medicaid in [place].  Encouraged patient to apply for any medical assistance possible and to be sure to follow up with [facility].  Patient verbalized understanding of information discussed and he states that he has no further questions at this time related to diabetes.  Spoke with Judeth Cornfield, CM and she has already called [facility]) and they do NOT have any long acting insulin (Levemir or Lantus). However, they have Humulin 70/30 insulin pens, Novolog insulin vials, Novolog 70/30 insulin vials, and Novolin N vials. Since patient has no insurance  and will need assistance with getting insulins at time of discharge, recommend switching to 70/30 insulin along with Novolog for correction so patient will be able to get from [facility] at time of discharge. Also, generic 70/30 can be purchased at [facility] for $25 per vial if patient needed to purchase it over the counter. Patient prefers to use insulin pens but is fine with insulin pens or vial/syringe.   At time of discharge, will need prescription for insulin(s) to get filled at [facility]. MD, please look for CM note about types of insulin available at [facility].   Thanks, Orlando Penner, RN, MSN, CDE Diabetes Coordinator Inpatient Diabetes Program [phone] (Team Pager) [phone] ([place] office) [phone] [facility] office) [phone] [facility] office)

## 2016-09-05 NOTE — Progress Notes (Signed)
MEDICATION RELATED CONSULT NOTE - INITIAL   Pharmacy Consult for Electrolyte Monitoring Indication: Insulin Drip  No Known Allergies  Patient Measurements: Height: 5\' 11"  (180.3 cm) Weight: 155 lb 13.8 oz (70.7 kg) IBW/kg (Calculated) : 75.3  Vital Signs: Temp: 98.2 F (36.8 C) (10/16 0931) Temp Source: Oral (10/16 0931) BP: 106/76 (10/16 0931) Pulse Rate: 83 (10/16 0931) Intake/Output from previous day: 10/15 0701 - 10/16 0700 In: 6102.5 [I.V.:6002.5; IV Piggyback:100] Out: 1650 [Urine:1650] Intake/Output from this shift: Total I/O In: 240 [P.O.:240] Out: 900 [Urine:900]  Labs:  Recent Labs  09/04/16 0827  09/05/16 0311 09/05/16 0801 09/05/16 1209 09/05/16 1607  WBC 9.3  --  5.1  --   --   --   HGB 17.3  --  14.3  --   --   --   HCT 50.3  --  41.5  --   --   --   PLT 372  --  220  --   --   --   CREATININE 1.33*  < > 0.90 0.90 0.94 0.83  MG  --   --   --   --   --  1.5*  ALBUMIN 4.4  --   --   --   --   --   PROT 8.0  --   --   --   --   --   AST 47*  --   --   --   --   --   ALT 49  --   --   --   --   --   ALKPHOS 161*  --   --   --   --   --   BILITOT 2.5*  --   --   --   --   --   < > = values in this interval not displayed. Estimated Creatinine Clearance: 140.8 mL/min (by C-G formula based on SCr of 0.83 mg/dL).   Microbiology: Recent Results (from the past 720 hour(s))  MRSA PCR Screening     Status: None   Collection Time: 09/04/16 11:46 AM  Result Value Ref Range Status   MRSA by PCR NEGATIVE NEGATIVE Final    Comment:        The GeneXpert MRSA Assay (FDA approved for NASAL specimens only), is one component of a comprehensive MRSA colonization surveillance program. It is not intended to diagnose MRSA infection nor to guide or monitor treatment for MRSA infections.     Medical History: Past Medical History:  Diagnosis Date  . Type 1 diabetes [facility](HCC)     Assessment: 20 y/o M with DKA on insulin drip. Pharmacy consulted to assist in  managing electrolytes/K.  Goal:  K > or = 4.5  Plan:  Labs drawn at 1600 today: K = 4.1, Mg = 1.5 Will give magnesium sulfate 2 g IV once and give KCl 40 mEq x 1 dose   Will follow up when K is rechecked ~2000 this evening. Will recheck Mg with AM labs tomorrow.  Cindi CarbonMary M Maizie Garno, PharmD Clinical Pharmacist 09/05/2016,4:51 PM

## 2016-09-06 LAB — BLOOD GAS, VENOUS
Acid-base deficit: 18.6 mmol/L — ABNORMAL HIGH (ref 0.0–2.0)
BICARBONATE: 8.9 mmol/L — AB (ref 20.0–28.0)
O2 Saturation: 59.5 %
PATIENT TEMPERATURE: 37
PH VEN: 7.14 — AB (ref 7.250–7.430)
PO2 VEN: 42 mmHg (ref 32.0–45.0)
pCO2, Ven: 26 mmHg — ABNORMAL LOW (ref 44.0–60.0)

## 2016-09-06 LAB — GLUCOSE, CAPILLARY
GLUCOSE-CAPILLARY: 117 mg/dL — AB (ref 65–99)
GLUCOSE-CAPILLARY: 87 mg/dL (ref 65–99)
GLUCOSE-CAPILLARY: 73 mg/dL (ref 65–99)
GLUCOSE-CAPILLARY: 112 mg/dL — AB (ref 65–99)
Glucose-Capillary: 175 mg/dL — ABNORMAL HIGH (ref 65–99)
Glucose-Capillary: 175 mg/dL — ABNORMAL HIGH (ref 65–99)
Glucose-Capillary: 201 mg/dL — ABNORMAL HIGH (ref 65–99)
Glucose-Capillary: 232 mg/dL — ABNORMAL HIGH (ref 65–99)
Glucose-Capillary: 340 mg/dL — ABNORMAL HIGH (ref 65–99)
Glucose-Capillary: 85 mg/dL (ref 65–99)
Glucose-Capillary: 86 mg/dL (ref 65–99)

## 2016-09-06 LAB — CBC
HCT: 38.7 % — ABNORMAL LOW (ref 40.0–52.0)
Hemoglobin: 13.5 g/dL (ref 13.0–18.0)
MCH: 32.9 pg (ref 26.0–34.0)
MCHC: 34.9 g/dL (ref 32.0–36.0)
MCV: 94.4 fL (ref 80.0–100.0)
PLATELETS: 195 10*3/uL (ref 150–440)
RBC: 4.1 MIL/uL — ABNORMAL LOW (ref 4.40–5.90)
RDW: 14.8 % — AB (ref 11.5–14.5)
WBC: 4.2 10*3/uL (ref 3.8–10.6)

## 2016-09-06 LAB — BASIC METABOLIC PANEL
Anion gap: 7 (ref 5–15)
BUN: 11 mg/dL (ref 6–20)
CALCIUM: 8.6 mg/dL — AB (ref 8.9–10.3)
CO2: 24 mmol/L (ref 22–32)
CREATININE: 0.7 mg/dL (ref 0.61–1.24)
Chloride: 109 mmol/L (ref 101–111)
GFR calc Af Amer: >60 mL/min (ref >60)
GFR calc non Af Amer: >60 mL/min (ref >60)
GLUCOSE: 103 mg/dL — AB (ref 65–99)
Potassium: 3.4 mmol/L — ABNORMAL LOW (ref 3.5–5.1)
Sodium: 140 mmol/L (ref 135–145)

## 2016-09-06 LAB — MAGNESIUM: Magnesium: 1.5 mg/dL — ABNORMAL LOW (ref 1.7–2.4)

## 2016-09-06 MED ORDER — INSULIN ASPART 100 UNIT/ML ~~LOC~~ SOLN
0.0000 [IU] | Freq: Every day | SUBCUTANEOUS | Status: DC
  Administered 2016-09-06: 2 [IU] via SUBCUTANEOUS
  Filled 2016-09-06: qty 5
  Filled 2016-09-06: qty 2

## 2016-09-06 MED ORDER — POTASSIUM CHLORIDE CRYS ER 20 MEQ PO TBCR
40.0000 meq | EXTENDED_RELEASE_TABLET | Freq: Once | ORAL | Status: AC
  Administered 2016-09-06: 40 meq via ORAL
  Filled 2016-09-06: qty 2

## 2016-09-06 MED ORDER — MAGNESIUM SULFATE 2 GM/50ML IV SOLN
2.0000 g | Freq: Once | INTRAVENOUS | Status: DC
  Administered 2016-09-06: 2 g via INTRAVENOUS
  Filled 2016-09-06: qty 50

## 2016-09-06 MED ORDER — MAGNESIUM SULFATE 2 GM/50ML IV SOLN
2.0000 g | Freq: Once | INTRAVENOUS | Status: AC
  Administered 2016-09-06: 2 g via INTRAVENOUS
  Filled 2016-09-06: qty 50

## 2016-09-06 MED ORDER — MAGNESIUM SULFATE 4 GM/100ML IV SOLN
4.0000 g | Freq: Once | INTRAVENOUS | Status: DC
  Filled 2016-09-06: qty 100

## 2016-09-06 MED ORDER — INSULIN ASPART 100 UNIT/ML ~~LOC~~ SOLN
4.0000 [IU] | Freq: Three times a day (TID) | SUBCUTANEOUS | Status: DC
  Administered 2016-09-06: 4 [IU] via SUBCUTANEOUS
  Filled 2016-09-06: qty 4

## 2016-09-06 MED ORDER — INSULIN ASPART 100 UNIT/ML ~~LOC~~ SOLN: 0.0000 [IU] | SUBCUTANEOUS | Status: DC

## 2016-09-06 MED ORDER — INSULIN DETEMIR 100 UNIT/ML ~~LOC~~ SOLN
12.0000 [IU] | Freq: Two times a day (BID) | SUBCUTANEOUS | Status: DC
  Administered 2016-09-06: 12 [IU] via SUBCUTANEOUS
  Filled 2016-09-06 (×3): qty 0.12

## 2016-09-06 MED ORDER — DOCUSATE SODIUM 100 MG PO CAPS: 100.0000 mg | ORAL_CAPSULE | Freq: Every day | ORAL | Status: DC | PRN

## 2016-09-06 MED ORDER — INSULIN ASPART PROT & ASPART (70-30 MIX) 100 UNIT/ML ~~LOC~~ SUSP
15.0000 [IU] | Freq: Two times a day (BID) | SUBCUTANEOUS | Status: DC
  Administered 2016-09-06 – 2016-09-07 (×2): 15 [IU] via SUBCUTANEOUS
  Filled 2016-09-06 (×2): qty 15

## 2016-09-06 MED ORDER — SODIUM CHLORIDE 0.9 % IV SOLN
INTRAVENOUS | Status: DC
  Administered 2016-09-06: 125 mL via INTRAVENOUS
  Administered 2016-09-06 – 2016-09-07 (×3): via INTRAVENOUS

## 2016-09-06 MED ORDER — INSULIN ASPART 100 UNIT/ML ~~LOC~~ SOLN
0.0000 [IU] | Freq: Three times a day (TID) | SUBCUTANEOUS | Status: DC
  Administered 2016-09-06: 18:00:00 7 [IU] via SUBCUTANEOUS
  Administered 2016-09-07: 5 [IU] via SUBCUTANEOUS
  Administered 2016-09-07: 2 [IU] via SUBCUTANEOUS
  Filled 2016-09-06: qty 7
  Filled 2016-09-06: qty 2

## 2016-09-06 MED ORDER — INSULIN ASPART 100 UNIT/ML ~~LOC~~ SOLN
0.0000 [IU] | Freq: Three times a day (TID) | SUBCUTANEOUS | Status: DC
Start: 2016-09-06 — End: 2016-09-06

## 2016-09-06 NOTE — Progress Notes (Signed)
Patient transferred to rm 108. Report given to Trey PaulaJeff, RN. Patient to transfer with no telemetry. CCMD and Elink notified of transfer. Patient in room with no complaints, RN Trey Paula(Jeff) present. Stephen Curry

## 2016-09-06 NOTE — Progress Notes (Signed)
Spoke with Diabetes coordinators Berna Spare(Julie M and Hilda LiasMarie B) as well as Dr. Amado CoeGouru. Hold patient's 70/30 and start at 1700, SSI to be changed to TID, and to give 4 units this am to cover breakfast, but after that dose d/c. Patient given graham crackers and peanut butter to prevent cbg drop, checked before 4 units only 112. Berna SpareJulie M spoke with Dr. Amado CoeGouru, waiting for order changes. Trudee KusterBrandi R Mansfield

## 2016-09-06 NOTE — Progress Notes (Signed)
[facility] [place] [facility]   PATIENT NAME: Stephen Curry    MR#:  [id number]  DATE OF BIRTH:  1996/05/20  SUBJECTIVE:  CHIEF COMPLAINT:  Patient's doing fine ,nausea and vomiting resolved. Denies any diarrhea or abdominal pain. . [place]  REVIEW OF SYSTEMS:  CONSTITUTIONAL: No fever, fatigue or weakness.  EYES: No blurred or double vision.  EARS, NOSE, AND THROAT: No tinnitus or ear pain.  RESPIRATORY: No cough, shortness of breath, wheezing or hemoptysis.  CARDIOVASCULAR: No chest pain, orthopnea, edema.  GASTROINTESTINAL: No nausea, vomiting, diarrhea or abdominal pain.  GENITOURINARY: No dysuria, hematuria.  ENDOCRINE: No polyuria, nocturia,  HEMATOLOGY: No anemia, easy bruising or bleeding SKIN: No rash or lesion. MUSCULOSKELETAL: No joint pain or arthritis.   NEUROLOGIC: No tingling, numbness, weakness.  PSYCHIATRY: No anxiety or depression.   DRUG ALLERGIES:  No Known Allergies  VITALS:  Blood pressure 106/82, pulse 86, temperature 97.7 F (36.5 C), temperature source Oral, resp. rate 16, height 5\' 11"  (1.803 m), weight 70.7 kg (155 lb 13.8 oz), SpO2 100 %.  PHYSICAL EXAMINATION:  GENERAL:  20 y.o.-year-old patient lying in the bed with no acute distress.  EYES: Pupils equal, round, reactive to light and accommodation. No scleral icterus. Extraocular muscles intact.  HEENT: Head atraumatic, normocephalic. Oropharynx and nasopharynx clear.  NECK:  Supple, no jugular venous distention. No thyroid enlargement, no tenderness.  LUNGS: Normal breath sounds bilaterally, no wheezing, rales,rhonchi or crepitation. No use of accessory muscles of respiration.  CARDIOVASCULAR: S1, S2 normal. No murmurs, rubs, or gallops.  ABDOMEN: Soft, nontender, nondistended. Bowel sounds present. No organomegaly or mass.  EXTREMITIES: No pedal edema, cyanosis, or clubbing.  NEUROLOGIC: Cranial nerves II  through XII are intact. Muscle strength 5/5 in all extremities. Sensation intact. Gait not checked.  PSYCHIATRIC: The patient is alert and oriented x 3.  SKIN: No obvious rash, lesion, or ulcer.    LABORATORY PANEL:   CBC  Recent Labs Lab 09/06/16 0355  WBC 4.2  HGB 13.5  HCT 38.7*  PLT 195   ------------------------------------------------------------------------------------------------------------------  Chemistries   Recent Labs Lab 09/04/16 0827  09/06/16 0355  NA 136  < > 140  K 4.3  < > 3.4*  CL 100*  < > 109  CO2 10*  < > 24  GLUCOSE 461*  < > 103*  BUN 13  < > 11  CREATININE 1.33*  < > 0.70  CALCIUM 10.0  < > 8.6*  MG  --   < > 1.5*  AST 47*  --   --   ALT 49  --   --   ALKPHOS 161*  --   --   BILITOT 2.5*  --   --   < > = values in this interval not displayed. ------------------------------------------------------------------------------------------------------------------  Cardiac Enzymes  Recent Labs Lab 09/04/16 0827  TROPONINI <0.03   ------------------------------------------------------------------------------------------------------------------  RADIOLOGY:  No results found.  EKG:   Orders placed or performed during the hospital encounter of 09/04/16  . EKG 12-Lead  . EKG 12-Lead  . ED EKG 12-Lead  . ED EKG 12-Lead    ASSESSMENT AND PLAN:   * Diabetic ketoacidosis- resolved Clinically improving Anion gap closed but bicarbonate  in the normal range Discontinue IV fluids and insulin drip.  We will change him to Lantus once bicarbonate is in the normal range  Accu-Cheks  Patient takes 40 units Lantus at home and can  be transitioned to Novolin 70/30 15 units twice a day  HbA1c -11.6 Outpatient follow-up with endocrinology is recommended Lactic acid level is nml Appreciate diabetic coordinator recommendations  * Acute kidney injury due to dehydration from nausea and vomiting.  Improved with IVF  *Hypokalemia replete potassium  and check a.m. labs including BMP and magnesium    All the records are reviewed and case discussed with Care Management/Social Workerr. Management plans discussed with the patient, family and they are in agreement.  CODE STATUS: fc   TOTAL  Critical care TIME TAKING CARE OF THIS PATIENT: 41 minutes.   POSSIBLE D/C IN AM  DAYS, DEPENDING ON CLINICAL CONDITION.  Note: This dictation was prepared with Dragon dictation along with smaller phrase technology. Any transcriptional errors that result from this process are unintentional.   Ramonita LabGouru, Elmus Mathes M.D on 09/06/2016 at 2:47 PM  Between 7am to 6pm - Pager - [phone] After 6pm go to [email or web address] - password [email or web address] [facility]  [facility]  Office  [phone]  CC: Primary care physician; No primary care provider on file.

## 2016-09-06 NOTE — Care Management (Signed)
Mr. Stephen Curry will be transferring out of ICU to room 108 today. Will give [facility] and [facility] applications. [name] RN MSN CCM Care Management [phone]

## 2016-09-06 NOTE — Progress Notes (Signed)
Paged Hugelmeyer with q4 hour bmp. co2 20. She states she will order the insulin transition orders

## 2016-09-06 NOTE — Progress Notes (Addendum)
MEDICATION RELATED CONSULT NOTE - INITIAL   Pharmacy Consult for Electrolyte Monitoring Indication: Insulin Drip  No Known Allergies  Patient Measurements: Height: 5\' 11"  (180.3 cm) Weight: 155 lb 13.8 oz (70.7 kg) IBW/kg (Calculated) : 75.3  Vital Signs: Temp: 98.6 F (37 C) (10/17 0000) Temp Source: Oral (10/17 0000) BP: 106/70 (10/17 0140) Pulse Rate: 81 (10/17 0140) Intake/Output from previous day: 10/16 0701 - 10/17 0700 In: 3575.3 [P.O.:720; I.V.:2855.3] Out: 2310 [Urine:2310] Intake/Output from this shift: Total I/O In: 3095.3 [P.O.:240; I.V.:2855.3] Out: 510 [Urine:510]  Labs:  Recent Labs  09/04/16 0827  09/05/16 0311  09/05/16 1607 09/05/16 1928 09/05/16 2300  WBC 9.3  --  5.1  --   --   --   --   HGB 17.3  --  14.3  --   --   --   --   HCT 50.3  --  41.5  --   --   --   --   PLT 372  --  220  --   --   --   --   CREATININE 1.33*  < > 0.90  < > 0.83 0.87 0.81  MG  --   --   --   --  1.5*  --   --   ALBUMIN 4.4  --   --   --   --   --   --   PROT 8.0  --   --   --   --   --   --   AST 47*  --   --   --   --   --   --   ALT 49  --   --   --   --   --   --   ALKPHOS 161*  --   --   --   --   --   --   BILITOT 2.5*  --   --   --   --   --   --   < > = values in this interval not displayed. Estimated Creatinine Clearance: 144.3 mL/min (by C-G formula based on SCr of 0.81 mg/dL).   Microbiology: Recent Results (from the past 720 hour(s))  MRSA PCR Screening     Status: None   Collection Time: 09/04/16 11:46 AM  Result Value Ref Range Status   MRSA by PCR NEGATIVE NEGATIVE Final    Comment:        The GeneXpert MRSA Assay (FDA approved for NASAL specimens only), is one component of a comprehensive MRSA colonization surveillance program. It is not intended to diagnose MRSA infection nor to guide or monitor treatment for MRSA infections.     Medical History: Past Medical History:  Diagnosis Date  . Type 1 diabetes [facility])     Assessment: 20  y/o M with DKA on insulin drip. Pharmacy consulted to assist in managing electrolytes/K.  Goal:  K > or = 4.5  Plan:  Labs drawn at 1600 today: K = 4.1, Mg = 1.5 Will give magnesium sulfate 2 g IV once and give KCl 40 mEq x 1 dose   Will follow up when K is rechecked ~2000 this evening. Will recheck Mg with AM labs tomorrow.  10/16 23:00 K+ 4.0. 40 mEq PO x1 ordered. Recheck BMP with AM labs.  10/17 AM Per RN drip is now off. Will go ahead with previous order of 40 mEq KCl for AM K+ of 3.4. Mg 1.5, 2 grams magnesium sulfate  IV x1 ordered. Recheck BMP and Mg in AM.  Nelwyn Hebdon S, PharmD Clinical Pharmacist 09/06/2016,1:43 AM

## 2016-09-06 NOTE — Progress Notes (Signed)
Diabetes history:DM1 Outpatient Diabetes medications: Lantus 40 units QHS, Novolog TID with meals (covered carbohydrates with 1 unit for every 10 grams of carbs; and correction with 1 unit for every 30 mg/dl above target of 604120 mg/dl  Spoke to both Dr. Amado CoeGouru and RN Gearldine BienenstockBrandy; orders received for  70/30 15 units BID (which will provide 21 units for basal and 9 units for MC per day) to begin at supper today.    Suggest changing correction insulin to Novolog sensitive correction 0-9 units tid and Novolog 0-5 units qhs  D/C meal time insulin now since 70/30 will provide meal coverage insulin.   Susette RacerJulie Nymir Ringler, RN, BA, MHA, CDE Diabetes Coordinator Inpatient Diabetes Program  [phone] (Team Pager) [phone] [facility] Office) 09/06/2016 10:51 AM

## 2016-09-06 NOTE — Progress Notes (Signed)
Inpatient Diabetes Program Recommendations  AACE/ADA: New Consensus Statement on Inpatient Glycemic Control (2015)  Target Ranges:  Prepandial:   less than 140 mg/dL      Peak postprandial:   less than 180 mg/dL (1-2 hours)      Critically ill patients:  140 - 180 mg/dL   Lab Results  Component Value Date   GLUCAP 73 09/06/2016   HGBA1C 11.9 (H) 09/04/2016    Review of Glycemic Control  Results for Rolin BarryRIFE, Mekhi (MRN [id number]) as of 09/06/2016 07:35  Ref. Range 09/06/2016 02:37 09/06/2016 03:34 09/06/2016 04:27 09/06/2016 05:30 09/06/2016 07:21  Glucose-Capillary Latest Ref Range: 65 - 99 mg/dL 782175 (H) 956117 (H) 87 86 73    Diabetes history: DM1 Outpatient Diabetes medications: Lantus 40 units QHS, Novolog TID with meals (covered carbohydrates with 1 unit for every 10 grams of carbs; and correction with 1 unit for every 30 mg/dl above target of 213120 mg/dl  Current orders for Inpatient glycemic control: Levemir 12 units bid, Novolog 4 units tid, Novolog correction 0-15 units tid, Novolog 0-5 units qhs  RN, Diabetes coordinator recommended 70/30 15 units BID (which will provide 21 units for basal and 9 units for MC per day) and Novolog 0-9 units correction q4h because he will not be able to get Lantus or Levemir at the Medication Management clinic or at a reasonable price over the counter once he is discharged (see note from Diabetes coordinator yesterday).    If he stays on Levemir while he is in the hospital, please order Novolog insulin as 1 unit for every 10 grams of carbohydrate- he will count his carbs and notify RN before the mealtime dose is given. This can be ordered as mealtime Novolog insulin 1-10 units- 1 unit/10 grams of carb.    Please change correction scale to sensitive (0-9 units) since he has Type 1 diabetes and is sensitive to insulin- 1 unit of insulin  drops him 30 points.   Please d/c all insulin IV orders since patient is now on SQ insulin.  Text page to Dr.  Liborio NixonGouru  Marqueta Pulley, RN, BA, [facility], CDE Diabetes Coordinator Inpatient Diabetes Program  [phone] (Team Pager) [phone] [facility] Office) 09/06/2016 7:45 AM

## 2016-09-06 NOTE — Progress Notes (Signed)
MEDICATION RELATED CONSULT NOTE   Pharmacy Consult for Electrolyte Monitoring    Pharmacy consulted for electrolyte management for 20 yo male admitted to ICU with DKA and on insulin drip. Patient is off insulin drip and is being transferred to floor.   Plan:  Patient received potassium 40mEq PO x 1 and magnesium 2g IV x 2. Will recheck electrolytes with am labs, if in range will complete consult.     No Known Allergies  Patient Measurements: Height: 5\' 11"  (180.3 cm) Weight: 155 lb 13.8 oz (70.7 kg) IBW/kg (Calculated) : 75.3  Vital Signs: Temp: 97.6 F (36.4 C) (10/17 1200) Temp Source: Oral (10/17 1200) BP: 106/73 (10/17 1200) Pulse Rate: 77 (10/17 1200) Intake/Output from previous day: 10/16 0701 - 10/17 0700 In: 4229.9 [P.O.:720; I.V.:3509.9] Out: 3130 [Urine:3130] Intake/Output from this shift: Total I/O In: 120 [P.O.:120] Out: -   Labs:  Recent Labs  09/04/16 0827  09/05/16 0311  09/05/16 1607 09/05/16 1928 09/05/16 2300 09/06/16 0355  WBC 9.3  --  5.1  --   --   --   --  4.2  HGB 17.3  --  14.3  --   --   --   --  13.5  HCT 50.3  --  41.5  --   --   --   --  38.7*  PLT 372  --  220  --   --   --   --  195  CREATININE 1.33*  < > 0.90  < > 0.83 0.87 0.81 0.70  MG  --   --   --   --  1.5*  --   --  1.5*  ALBUMIN 4.4  --   --   --   --   --   --   --   PROT 8.0  --   --   --   --   --   --   --   AST 47*  --   --   --   --   --   --   --   ALT 49  --   --   --   --   --   --   --   ALKPHOS 161*  --   --   --   --   --   --   --   BILITOT 2.5*  --   --   --   --   --   --   --   < > = values in this interval not displayed. Estimated Creatinine Clearance: 146.1 mL/min (by C-G formula based on SCr of 0.7 mg/dL).   Microbiology: Recent Results (from the past 720 hour(s))  MRSA PCR Screening     Status: None   Collection Time: 09/04/16 11:46 AM  Result Value Ref Range Status   MRSA by PCR NEGATIVE NEGATIVE Final    Comment:        The GeneXpert MRSA  Assay (FDA approved for NASAL specimens only), is one component of a comprehensive MRSA colonization surveillance program. It is not intended to diagnose MRSA infection nor to guide or monitor treatment for MRSA infections.     Medical History: Past Medical History:  Diagnosis Date  . Type 1 diabetes [facility](HCC)     Pharmacy will continue to monitor and adjust per consult.    Danyla Wattley L 09/06/2016,1:36 PM

## 2016-09-07 LAB — URINALYSIS COMPLETE WITH MICROSCOPIC (ARMC ONLY)
Bacteria, UA: NONE SEEN
Bilirubin Urine: NEGATIVE
Hgb urine dipstick: NEGATIVE
KETONES UR: NEGATIVE mg/dL
Leukocytes, UA: NEGATIVE
NITRITE: NEGATIVE
PROTEIN: NEGATIVE mg/dL
RBC / HPF: NONE SEEN RBC/hpf (ref 0–5)
SPECIFIC GRAVITY, URINE: 1.006 (ref 1.005–1.030)
Squamous Epithelial / LPF: NONE SEEN
pH: 5 (ref 5.0–8.0)

## 2016-09-07 LAB — BASIC METABOLIC PANEL
ANION GAP: 7 (ref 5–15)
BUN: 10 mg/dL (ref 6–20)
CHLORIDE: 106 mmol/L (ref 101–111)
CO2: 28 mmol/L (ref 22–32)
CREATININE: 0.48 mg/dL — AB (ref 0.61–1.24)
Calcium: 8.6 mg/dL — ABNORMAL LOW (ref 8.9–10.3)
GFR calc non Af Amer: >60 mL/min (ref >60)
Glucose, Bld: 129 mg/dL — ABNORMAL HIGH (ref 65–99)
Potassium: 3.4 mmol/L — ABNORMAL LOW (ref 3.5–5.1)
Sodium: 141 mmol/L (ref 135–145)

## 2016-09-07 LAB — GLUCOSE, CAPILLARY
GLUCOSE-CAPILLARY: 300 mg/dL — AB (ref 65–99)
Glucose-Capillary: 213 mg/dL — ABNORMAL HIGH (ref 65–99)
Glucose-Capillary: 163 mg/dL — ABNORMAL HIGH (ref 65–99)

## 2016-09-07 LAB — URINE DRUG SCREEN, QUALITATIVE (ARMC ONLY)
AMPHETAMINES, UR SCREEN: NOT DETECTED
BARBITURATES, UR SCREEN: NOT DETECTED
BENZODIAZEPINE, UR SCRN: NOT DETECTED
Cannabinoid 50 Ng, Ur ~~LOC~~: NOT DETECTED
Cocaine Metabolite,Ur ~~LOC~~: NOT DETECTED
MDMA (Ecstasy)Ur Screen: NOT DETECTED
METHADONE SCREEN, URINE: NOT DETECTED
Opiate, Ur Screen: NOT DETECTED
Phencyclidine (PCP) Ur S: NOT DETECTED
TRICYCLIC, UR SCREEN: NOT DETECTED

## 2016-09-07 LAB — MAGNESIUM
Magnesium: 1.5 mg/dL — ABNORMAL LOW (ref 1.7–2.4)
Magnesium: 2.3 mg/dL (ref 1.7–2.4)

## 2016-09-07 MED ORDER — INSULIN ASPART PROT & ASPART (70-30 MIX) 100 UNIT/ML ~~LOC~~ SUSP
18.0000 [IU] | Freq: Two times a day (BID) | SUBCUTANEOUS | Status: DC
Start: 2016-09-07 — End: 2016-09-07

## 2016-09-07 MED ORDER — MAGNESIUM SULFATE 4 GM/100ML IV SOLN
4.0000 g | Freq: Once | INTRAVENOUS | Status: AC
  Administered 2016-09-07: 09:00:00 4 g via INTRAVENOUS
  Filled 2016-09-07: qty 100

## 2016-09-07 MED ORDER — INSULIN ASPART 100 UNIT/ML ~~LOC~~ SOLN: 0.0000 [IU] | Freq: Every day | SUBCUTANEOUS | 11 refills | Status: DC

## 2016-09-07 MED ORDER — DOCUSATE SODIUM 100 MG PO CAPS: 100.0000 mg | ORAL_CAPSULE | Freq: Every day | ORAL | 0 refills | Status: DC | PRN

## 2016-09-07 MED ORDER — INSULIN ASPART 100 UNIT/ML ~~LOC~~ SOLN: 0.0000 [IU] | Freq: Three times a day (TID) | SUBCUTANEOUS | 5 refills | Status: DC

## 2016-09-07 MED ORDER — INSULIN ASPART PROT & ASPART (70-30 MIX) 100 UNIT/ML ~~LOC~~ SUSP: 18.0000 [IU] | Freq: Two times a day (BID) | SUBCUTANEOUS | 11 refills | Status: DC

## 2016-09-07 MED ORDER — POTASSIUM CHLORIDE CRYS ER 20 MEQ PO TBCR
40.0000 meq | EXTENDED_RELEASE_TABLET | ORAL | Status: DC
  Administered 2016-09-07: 11:00:00 40 meq via ORAL
  Filled 2016-09-07: qty 2

## 2016-09-07 NOTE — Care Management (Signed)
Spoke with Stephen Curry at the bedside. States he has been a diabetic x 7 years. Moved to [place] to live with his father about 2 months ago. States he has a glucometer and strips . [facility] No follow-up physician. [facility] given. Will update Lelon MastLorrie Holt at the [facility].  Stressed the importance of going to the [facility] and continuing to check his blood sugars on a regular basis.  Discharge to home today per Dr. Amado CoeGouru. Gwenette GreetBrenda S Jeniyah Menor RN MSN CCM Care Management [phone]

## 2016-09-07 NOTE — Progress Notes (Signed)
MEDICATION RELATED CONSULT NOTE   Pharmacy Consult for Electrolyte Monitoring    Pharmacy consulted for electrolyte management for 20 yo male admitted to ICU with DKA and on insulin drip. Patient is off insulin drip and is now on the floor.   Plan:  Patient received potassium 40mEq PO x 1 and magnesium 2g IV x 2 yesterday. K remains at 3.4 and Mg remains at 1.5.  Will give another 4g of Mg IV and 40 MEQ po KCL x 2 doses. Recheck in AM if pt is still here.   No Known Allergies  Patient Measurements: Height: 5\' 11"  (180.3 cm) Weight: 156 lb 6.4 oz (70.9 kg) IBW/kg (Calculated) : 75.3  Vital Signs: Temp: 97.6 F (36.4 C) (10/18 0414) Temp Source: Oral (10/18 0414) BP: 103/67 (10/18 0414) Pulse Rate: 76 (10/18 0414) Intake/Output from previous day: 10/17 0701 - 10/18 0700 In: 360 [P.O.:360] Out: -  Intake/Output from this shift: No intake/output data recorded.  Labs:  Recent Labs  09/04/16 0827  09/05/16 0311  09/05/16 1607  09/05/16 2300 09/06/16 0355 09/07/16 0418  WBC 9.3  --  5.1  --   --   --   --  4.2  --   HGB 17.3  --  14.3  --   --   --   --  13.5  --   HCT 50.3  --  41.5  --   --   --   --  38.7*  --   PLT 372  --  220  --   --   --   --  195  --   CREATININE 1.33*  < > 0.90  < > 0.83  < > 0.81 0.70 0.48*  MG  --   --   --   --  1.5*  --   --  1.5* 1.5*  ALBUMIN 4.4  --   --   --   --   --   --   --   --   PROT 8.0  --   --   --   --   --   --   --   --   AST 47*  --   --   --   --   --   --   --   --   ALT 49  --   --   --   --   --   --   --   --   ALKPHOS 161*  --   --   --   --   --   --   --   --   BILITOT 2.5*  --   --   --   --   --   --   --   --   < > = values in this interval not displayed. Estimated Creatinine Clearance: 146.5 mL/min (by C-G formula based on SCr of 0.48 mg/dL (L)).   Microbiology: Recent Results (from the past 720 hour(s))  MRSA PCR Screening     Status: None   Collection Time: 09/04/16 11:46 AM  Result Value Ref Range  Status   MRSA by PCR NEGATIVE NEGATIVE Final    Comment:        The GeneXpert MRSA Assay (FDA approved for NASAL specimens only), is one component of a comprehensive MRSA colonization surveillance program. It is not intended to diagnose MRSA infection nor to guide or monitor treatment for MRSA infections.     Medical  History: Past Medical History:  Diagnosis Date  . Type 1 diabetes [facility])     Pharmacy will continue to monitor and adjust per consult.    Taejah Ohalloran D Fitz Matsuo 09/07/2016,7:22 AM

## 2016-09-07 NOTE — Progress Notes (Addendum)
Inpatient Diabetes Program Recommendations  AACE/ADA: New Consensus Statement on Inpatient Glycemic Control (2015)  Target Ranges:  Prepandial:   less than 140 mg/dL      Peak postprandial:   less than 180 mg/dL (1-2 hours)      Critically ill patients:  140 - 180 mg/dL   Results for Rolin BarryRIFE, Lotus (MRN [id number]) as of 09/07/2016 09:48  Ref. Range 09/06/2016 07:21 09/06/2016 10:39 09/06/2016 12:22 09/06/2016 16:39 09/06/2016 21:13 09/07/2016 07:59  Glucose-Capillary Latest Ref Range: 65 - 99 mg/dL 73 409112 (H) 85 811340 (H) 914201 (H) 300 (H)  Results for Rolin BarryRIFE, Nayib (MRN [id number]) as of 09/07/2016 09:48  Ref. Range 09/07/2016 04:18  Glucose Latest Ref Range: 65 - 99 mg/dL 086129 (H)   Review of Glycemic Control  Current orders for Inpatient glycemic control: 70/30 15 units BID, Novolog 0-9 units TID with meals, Novolog 0-5 units QHS  Inpatient Diabetes Program Recommendations: Insulin - Basal: Please consider increasing 70/30 to 18 units BID.  NOTE: Noted patient' lab glucose 129 mg/dl at 5:784:18 today and finger stick 300 mg/dl at 8am today. Called patient over phone and asked if he had eaten anything between lab draw and finger stick. Patient reports that he ate saltine crackers and drank sprite zero around 4:30-5:00 am today.   Thanks, Orlando PennerMarie Zeenat Jeanbaptiste, RN, MSN, CDE Diabetes Coordinator Inpatient Diabetes Program [phone] (Team Pager from 8am to 5pm) [phone] ([facility] office) [phone] [facility] office) [phone] [facility] office)

## 2016-09-07 NOTE — Progress Notes (Signed)
Discharge instructions, prescriptions given to patient. [facility].  Dad here to pick up patient

## 2016-09-07 NOTE — Discharge Summary (Signed)
[facility] [place] at [facility]   PATIENT NAME: Stephen Curry    MR#:  [id number]  DATE OF BIRTH:  Jun 02, 1996  DATE OF ADMISSION:  09/04/2016 ADMITTING PHYSICIAN: Milagros Loll, MD  DATE OF DISCHARGE: 09/07/16 PRIMARY CARE PHYSICIAN: No primary care provider on file.    ADMISSION DIAGNOSIS:  Diabetic ketoacidosis without coma associated with type 1 diabetes mellitus (HCC) [E10.10]  DISCHARGE DIAGNOSIS:  Active Problems:   DKA (diabetic ketoacidoses) (HCC)   SECONDARY DIAGNOSIS:   Past Medical History:  Diagnosis Date  . Type 1 diabetes [facility])     HOSPITAL COURSE:   * Diabetic ketoacidosis- resolved Clinically improving Anion gap closed , bicarbonate  in the normal range Discontinue IV fluids and insulin drip.  Patients Lantus at home discontinued and  transitioned to Novolin 70/30 18 units twice a day  HbA1c -11.6- uncontrolled Outpatient follow-up with endocrinology is recommended Lactic acid level is nml Appreciate diabetic coordinator recommendations [facility] Diabetic education provided  * hypokalemia and hypomagnesemia - repleted  * Acute kidney injury due to dehydration from nausea and vomiting.  Improved with IVF    DISCHARGE CONDITIONS:   fair  CONSULTS OBTAINED:     PROCEDURES none  DRUG ALLERGIES:  No Known Allergies  DISCHARGE MEDICATIONS:   Current Discharge Medication List    START taking these medications   Details  docusate sodium (COLACE) 100 MG capsule Take 1 capsule (100 mg total) by mouth daily as needed for mild constipation. Qty: 10 capsule, Refills: 0    insulin aspart protamine- aspart (NOVOLOG MIX 70/30) (70-30) 100 UNIT/ML injection Inject 0.18 mLs (18 Units total) into the skin 2 (two) times daily with a meal. Qty: 10 mL, Refills: 11      CONTINUE these medications which have CHANGED   Details  !! insulin aspart (NOVOLOG) 100 UNIT/ML injection Inject 0-5 Units into the  skin at bedtime. Qty: 10 mL, Refills: 11    !! insulin aspart (NOVOLOG) 100 UNIT/ML injection Inject 0-9 Units into the skin 3 (three) times daily with meals. Qty: 10 mL, Refills: 5     !! - Potential duplicate medications found. Please discuss with provider.    STOP taking these medications     insulin glargine (LANTUS) 100 UNIT/ML injection          DISCHARGE INSTRUCTIONS:   Activity as tolerated [facility] Follow-up with endocrinology in 1-2 weeks  DIET:  Diabetic diet  DISCHARGE CONDITION:  Fair  ACTIVITY:  Activity as tolerated  OXYGEN:  Home Oxygen: No.   Oxygen Delivery: room air  DISCHARGE LOCATION:  home   If you experience worsening of your admission symptoms, develop shortness of breath, life threatening emergency, suicidal or homicidal thoughts you must seek medical attention immediately by calling 911 or calling your MD immediately  if symptoms less severe.  You Must read complete instructions/literature along with all the possible adverse reactions/side effects for all the Medicines you take and that have been prescribed to you. Take any new Medicines after you have completely understood and accpet all the possible adverse reactions/side effects.   Please note  You were cared for by a hospitalist during your hospital stay. If you have any questions about your discharge medications or the care you received while you were in the hospital after you are discharged, you can call the unit and asked to speak with the hospitalist on call if the hospitalist  that took care of you is not available. Once you are discharged, your primary care physician will handle any further medical issues. Please note that NO REFILLS for any discharge medications will be authorized once you are discharged, as it is imperative that you return to your primary care physician (or establish a relationship with a primary care physician if you do not have one)  for your aftercare needs so that they can reassess your need for medications and monitor your lab values.     Today  Chief Complaint  Patient presents with  . Emesis   Patient is doing fine. Ambulating with no complaints. Eating well ROS:  CONSTITUTIONAL: Denies fevers, chills. Denies any fatigue, weakness.  EYES: Denies blurry vision, double vision, eye pain. EARS, NOSE, THROAT: Denies tinnitus, ear pain, hearing loss. RESPIRATORY: Denies cough, wheeze, shortness of breath.  CARDIOVASCULAR: Denies chest pain, palpitations, edema.  GASTROINTESTINAL: Denies nausea, vomiting, diarrhea, abdominal pain. Denies bright red blood per rectum. GENITOURINARY: Denies dysuria, hematuria. ENDOCRINE: Denies nocturia or thyroid problems. HEMATOLOGIC AND LYMPHATIC: Denies easy bruising or bleeding. SKIN: Denies rash or lesion. MUSCULOSKELETAL: Denies pain in neck, back, shoulder, knees, hips or arthritic symptoms.  NEUROLOGIC: Denies paralysis, paresthesias.  PSYCHIATRIC: Denies anxiety or depressive symptoms.   VITAL SIGNS:  Blood pressure 107/77, pulse 86, temperature 98.2 F (36.8 C), temperature source Oral, resp. rate 18, height 5\' 11"  (1.803 m), weight 70.9 kg (156 lb 6.4 oz), SpO2 99 %.  I/O:    Intake/Output Summary (Last 24 hours) at 09/07/16 1410 Last data filed at 09/06/16 1800  Gross per 24 hour  Intake              240 ml  Output                0 ml  Net              240 ml    PHYSICAL EXAMINATION:  GENERAL:  20 y.o.-year-old patient lying in the bed with no acute distress.  EYES: Pupils equal, round, reactive to light and accommodation. No scleral icterus. Extraocular muscles intact.  HEENT: Head atraumatic, normocephalic. Oropharynx and nasopharynx clear.  NECK:  Supple, no jugular venous distention. No thyroid enlargement, no tenderness.  LUNGS: Normal breath sounds bilaterally, no wheezing, rales,rhonchi or crepitation. No use of accessory muscles of respiration.   CARDIOVASCULAR: S1, S2 normal. No murmurs, rubs, or gallops.  ABDOMEN: Soft, non-tender, non-distended. Bowel sounds present. No organomegaly or mass.  EXTREMITIES: No pedal edema, cyanosis, or clubbing.  NEUROLOGIC: Cranial nerves II through XII are intact. Muscle strength 5/5 in all extremities. Sensation intact. Gait not checked.  PSYCHIATRIC: The patient is alert and oriented x 3.  SKIN: No obvious rash, lesion, or ulcer.   DATA REVIEW:   CBC  Recent Labs Lab 09/06/16 0355  WBC 4.2  HGB 13.5  HCT 38.7*  PLT 195    Chemistries   Recent Labs Lab 09/04/16 0827  09/07/16 0418 09/07/16 1220  NA 136  < > 141  --   K 4.3  < > 3.4*  --   CL 100*  < > 106  --   CO2 10*  < > 28  --   GLUCOSE 461*  < > 129*  --   BUN 13  < > 10  --   CREATININE 1.33*  < > 0.48*  --   CALCIUM 10.0  < > 8.6*  --   MG  --   < >  1.5* 2.3  AST 47*  --   --   --   ALT 49  --   --   --   ALKPHOS 161*  --   --   --   BILITOT 2.5*  --   --   --   < > = values in this interval not displayed.  Cardiac Enzymes  Recent Labs Lab 09/04/16 0827  TROPONINI <0.03    Microbiology Results  Results for orders placed or performed during the hospital encounter of 09/04/16  MRSA PCR Screening     Status: None   Collection Time: 09/04/16 11:46 AM  Result Value Ref Range Status   MRSA by PCR NEGATIVE NEGATIVE Final    Comment:        The GeneXpert MRSA Assay (FDA approved for NASAL specimens only), is one component of a comprehensive MRSA colonization surveillance program. It is not intended to diagnose MRSA infection nor to guide or monitor treatment for MRSA infections.     RADIOLOGY:  Dg Chest Port 1 View  Result Date: 09/04/2016 CLINICAL DATA:  Vomiting, cough for 2 days, diabetic, DKA EXAM: PORTABLE CHEST 1 VIEW COMPARISON:  None. FINDINGS: The heart size and mediastinal contours are within normal limits. Both lungs are clear. The visualized skeletal structures are unremarkable.  IMPRESSION: No active disease. Electronically Signed   By: Bary Richard M.D.   On: 09/04/2016 09:07    EKG:   Orders placed or performed during the hospital encounter of 09/04/16  . EKG 12-Lead  . EKG 12-Lead  . ED EKG 12-Lead  . ED EKG 12-Lead      Management plans discussed with the patient, family and they are in agreement.  CODE STATUS:     Code Status Orders        Start     Ordered   09/04/16 1014  Full code  Continuous     09/04/16 1014    Code Status History    Date Active Date Inactive Code Status Order ID Comments User Context   This patient has a current code status but no historical code status.      TOTAL TIME TAKING CARE OF THIS PATIENT:  45 minutes.   Note: This dictation was prepared with Dragon dictation along with smaller phrase technology. Any transcriptional errors that result from this process are unintentional.   @[facility] @  on 09/07/2016 at 2:10 PM  Between 7am to 6pm - Pager - [phone]  After 6pm go to [email or web address] - password [email or web address] [facility]  [place]  [facility]  Office  [phone]  CC: Primary care physician; No primary care provider on file.

## 2016-09-07 NOTE — Discharge Instructions (Signed)
Blood Glucose Monitoring, Adult °Monitoring your blood glucose (also know as blood sugar) helps you to manage your diabetes. It also helps you and your health care provider monitor your diabetes and determine how well your treatment plan is working. °WHY SHOULD YOU MONITOR YOUR BLOOD GLUCOSE? °· It can help you understand how food, exercise, and medicine affect your blood glucose. °· It allows you to know what your blood glucose is at any given moment. You can quickly tell if you are having low blood glucose (hypoglycemia) or high blood glucose (hyperglycemia). °· It can help you and your health care provider know how to adjust your medicines. °· It can help you understand how to manage an illness or adjust medicine for exercise. °WHEN SHOULD YOU TEST? °Your health care provider will help you decide how often you should check your blood glucose. This may depend on the type of diabetes you have, your diabetes control, or the types of medicines you are taking. Be sure to write down all of your blood glucose readings so that this information can be reviewed with your health care provider. See below for examples of testing times that your health care provider may suggest. °Type 1 Diabetes °· Test at least 2 times per day if your diabetes is well controlled, if you are using an insulin pump, or if you perform multiple daily injections. °· If your diabetes is not well controlled or if you are sick, you may need to test more often. °· It is a good idea to also test: °¨ Before every insulin injection. °¨ Before and after exercise. °¨ Between meals and 2 hours after a meal. °¨ Occasionally between 2:00 a.m. and 3:00 a.m. °Type 2 Diabetes °· If you are taking insulin, test at least 2 times per day. However, it is best to test before every insulin injection. °· If you take medicines by mouth (orally), test 2 times a day. °· If you are on a controlled diet, test once a day. °· If your diabetes is not well controlled or if you  are sick, you may need to monitor more often. °HOW TO MONITOR YOUR BLOOD GLUCOSE °Supplies Needed °· Blood glucose meter. °· Test strips for your meter. Each meter has its own strips. You must use the strips that go with your own meter. °· A pricking needle (lancet). °· A device that holds the lancet (lancing device). °· A journal or log book to write down your results. °Procedure °· Wash your hands with soap and water. Alcohol is not preferred. °· Prick the side of your finger (not the tip) with the lancet. °· Gently milk the finger until a small drop of blood appears. °· Follow the instructions that come with your meter for inserting the test strip, applying blood to the strip, and using your blood glucose meter. °Other Areas to Get Blood for Testing °Some meters allow you to use other areas of your body (other than your finger) to test your blood. These areas are called alternative sites. The most common alternative sites are: °· The forearm. °· The thigh. °· The back area of the lower leg. °· The palm of the hand. °The blood flow in these areas is slower. Therefore, the blood glucose values you get may be delayed, and the numbers are different from what you would get from your fingers. Do not use alternative sites if you think you are having hypoglycemia. Your reading will not be accurate. Always use a finger if you are   having hypoglycemia. Also, if you cannot feel your lows (hypoglycemia unawareness), always use your fingers for your blood glucose checks. ADDITIONAL TIPS FOR GLUCOSE MONITORING  Do not reuse lancets.  Always carry your supplies with you.  All blood glucose meters have a 24-hour "hotline" number to call if you have questions or need help.  Adjust (calibrate) your blood glucose meter with a control solution after finishing a few boxes of strips. BLOOD GLUCOSE RECORD KEEPING It is a good idea to keep a daily record or log of your blood glucose readings. Most glucose meters, if not all,  keep your glucose records stored in the meter. Some meters come with the ability to download your records to your home computer. Keeping a record of your blood glucose readings is especially helpful if you are wanting to look for patterns. Make notes to go along with the blood glucose readings because you might forget what happened at that exact time. Keeping good records helps you and your health care provider to work together to achieve good diabetes management.    This information is not intended to replace advice given to you by your health care provider. Make sure you discuss any questions you have with your health care provider.   Document Released: 11/10/2003 Document Revised: 11/28/2014 Document Reviewed: 04/01/2013 Elsevier Interactive Patient Education [employer]  Your health care provider has decided you need to take insulin regularly. You have been given a correction scale (also called a sliding scale) in case you need extra insulin when your blood sugar is too high (hyperglycemia). The following instructions will assist you in how to use that correction scale.  WHAT IS A CORRECTION SCALE?  When you check your blood sugar, sometimes it will be higher than your health care provider has told you it should be. You may need an extra dose of insulin to bring your blood sugar to the recommended level (also known as your goal, target, or normal level). The correction scale is prescribed by your health care provider based on your specific needs.  Your correction scale has two parts:   The first shows you a blood sugar range.   The second part tells you how much extra insulin to give yourself if your blood sugar falls within this range. You will not need an extra dose of insulin if your blood glucose is in the desired range. You should simply give yourself the normal amount of insulin that your health care provider has ordered for you.  WHY IS IT IMPORTANT TO KEEP YOUR BLOOD SUGAR  LEVELS AT YOUR DESIRED LEVEL?  Keeping your blood sugar at the desired level helps to prevent long-term complications of diabetes, such as eye disease, kidney failure, nerve damage, and other serious complications. WHAT TYPE OF INSULIN WILL YOU USE?  To help bring down blood sugar levels that are too high, your health care provider will prescribe a short-acting or a rapid-acting insulin. An example of a short-acting insulin would be regular insulin. Remember, you may also have a longer-acting insulin prescribed for you.  WHAT DO YOU NEED TO DO?   Check your blood sugar with your home blood glucose meter as recommended by your health care provider.   Using your correction scale, find the range that your blood sugar lies in.   Look for the units of insulin that match that blood sugar range. Give yourself the dose of correction insulin your health care provider has prescribed. Always make sure you are using the  right type of insulin.   Prior to the injection, make sure you have food available that you can eat in the next 15-30 minutes.   If your correction insulin is rapid acting, start eating your meal within 15 minutes after you have given yourself the insulin injection. If you wait longer than 15 minutes to eat, your blood sugar might get too low.   If your correction insulin is short acting(regular), start eating your meal within 30 minutes after you have given yourself the insulin injection. If you wait longer than 30 minutes to eat, your blood sugar might get too low. Symptoms of low blood sugar (hypoglycemia) may include feeling shaky or weak, sweating, feeling confused, difficulty seeing, agitation, crankiness, or numbness of the lips or tongue. Check your blood sugar immediately and treat your results as directed by your health care provider.   Keep a log of your blood sugar results with the time you took the test and the amount of insulin that you injected. This information will  help your health care provider manage your medicines.   Note on your log anything that may affect your blood sugar level, such as:   Changes in normal exercise or activity.   Changes in your normal schedule, such as staying up late, going on vacation, changing your diet, or holidays.   New medicines. This includes prescription and over-the-counter medicines. Some medicines may cause high blood sugar.   Sickness, stress, or anxiety.   Changes in the time you took your medicine.   Changes in your meals, such as skipping a meal, having a late meal, or dining out.   Eating things that may affect blood glucose, such as snacks, meal portions that are larger than normal, drinks with sugar, or eating less than usual.   Ask your health care provider any questions you have.  Be aware of "stacking" your insulin doses. This happens when you correct a high blood sugar level by giving yourself extra insulin too soon after a previous correction dose or mealtime dose. You may then have too much insulin still active in your body and may be at risk for hypoglycemia. WHY DO YOU NEED A CORRECTION SCALE IF YOU HAVE NEVER BEEN DIAGNOSED WITH DIABETES?   Keeping your blood glucose in the target range is important for your overall health.   You may have been prescribed medicines that cause your blood glucose to be higher than normal. WHEN SHOULD YOU SEEK MEDICAL CARE? Contact your health care provider if:   You have experienced hypoglycemia that you are unable to treat with your usual routine.   You have a high blood sugar level that is not coming down with the correction dose.  Your blood sugar is often too low or does not come up even if you eat a fast-acting carbohydrate. Someone who lives with you should seek immediate medical care if you become unresponsive.   This information is not intended to replace advice given to you by your health care provider. Make sure you discuss any  questions you have with your health care provider.   Document Released: 03/31/2011 Document Revised: 07/10/2013 Document Reviewed: 04/19/2013 Elsevier Interactive Patient Education [employer]

## 2016-10-11 ENCOUNTER — Ambulatory Visit: Payer: Self-pay | Admitting: Pharmacy Technician

## 2016-10-11 DIAGNOSIS — Z79899 Other long term (current) drug therapy: Secondary | ICD-10-CM

## 2016-10-11 NOTE — Progress Notes (Signed)
Met with patient completed financial assistance application for [facility] due to recent ED visit.  Patient agreed to be responsible for gathering financial information and forwarding to appropriate department in [place].    Completed [facility] application and contract.  Patient agreed to all terms of the [facility] contract.  Patient to provide bill linking him to address provided on application.  Provided patient with community resource material based on his particular needs.    Betty J. Kluttz Care Manager [facility]  

## 2016-11-09 ENCOUNTER — Other Ambulatory Visit: Payer: Self-pay

## 2016-11-09 ENCOUNTER — Telehealth: Payer: Self-pay

## 2016-11-09 ENCOUNTER — Ambulatory Visit: Payer: Self-pay | Admitting: Internal Medicine

## 2016-11-09 ENCOUNTER — Encounter: Payer: Self-pay | Admitting: Internal Medicine

## 2016-11-09 DIAGNOSIS — E109 Type 1 diabetes mellitus without complications: Secondary | ICD-10-CM

## 2016-11-09 DIAGNOSIS — E1010 Type 1 diabetes mellitus with ketoacidosis without coma: Secondary | ICD-10-CM

## 2016-11-09 DIAGNOSIS — Z0000 Encounter for general adult medical examination without abnormal findings: Secondary | ICD-10-CM

## 2016-11-09 LAB — GLUCOSE, POCT (MANUAL RESULT ENTRY): POC GLUCOSE: 163 mg/dL — AB (ref 70–99)

## 2016-11-09 MED ORDER — INSULIN LISPRO 100 UNIT/ML ~~LOC~~ SOLN: 33.0000 [IU] | Freq: Three times a day (TID) | SUBCUTANEOUS | 11 refills | Status: DC

## 2016-11-09 MED ORDER — INSULIN GLARGINE 100 UNIT/ML ~~LOC~~ SOLN: 45.0000 [IU] | Freq: Every day | SUBCUTANEOUS | 11 refills | Status: DC

## 2016-11-09 MED ORDER — INSULIN GLULISINE 100 UNIT/ML IJ SOLN: 33.0000 [IU] | Freq: Three times a day (TID) | INTRAMUSCULAR | 11 refills | Status: DC

## 2016-11-09 NOTE — Telephone Encounter (Signed)
Called pt and scheduled his endocrinology appt. Gave pt appt. Pt verbalized understanding.

## 2016-11-09 NOTE — Progress Notes (Signed)
   Subjective:    Patient ID: Stephen Curry, male    DOB: 11/06/1996, 20 y.o.   MRN: [id number]  HPI BP 110/80   Pulse (!) 101   Temp 97.8 F (36.6 C) (Oral)   Wt 160 lb (72.6 kg)   BMI 22.32 kg/m   Allergies as of 11/09/2016   No Known Allergies     Medication List       Accurate as of 11/09/16 12:43 PM. Always use your most recent med list.          insulin aspart 100 UNIT/ML injection Commonly known as:  novoLOG Inject 0-5 Units into the skin at bedtime.   insulin aspart 100 UNIT/ML injection Commonly known as:  novoLOG Inject 0-9 Units into the skin 3 (three) times daily with meals.   insulin glargine 100 UNIT/ML injection Commonly known as:  LANTUS Inject 0.45 mLs (45 Units total) into the skin at bedtime.   insulin glulisine 100 UNIT/ML injection Commonly known as:  APIDRA Inject 0.33 mLs (33 Units total) into the skin 3 (three) times daily before meals.   insulin lispro 100 UNIT/ML injection Commonly known as:  HUMALOG Inject 0.33 mLs (33 Units total) into the skin 3 (three) times daily before meals. Up to 10 units before meals and at qhs based on sliding scale of 1 unit for 30 points above 120      Pt moved from [place]. He was recently discharged from [facility] for ketoacidosis. He is a type 1 diabetic.  He presents with no insulin and has been out for 1 day now.He checks his sugars daily. He uses a 30-120 ratio he gives 1 unit for every 30 points above 120, 3-4 times a dap depending on self monitoring sugars.   Review of Systems     Objective:   Physical Exam  Constitutional: He is oriented to person, place, and time.  Cardiovascular: Normal rate, regular rhythm and normal heart sounds.   Pulmonary/Chest: Effort normal and breath sounds normal.  Neurological: He is alert and oriented to person, place, and time. He has normal reflexes.          Assessment & Plan:  He appears to be very compliant and has worked for him for years. He also takes lantus 45  units qhs, along with the humalog 30/120 ratio. Breath sounds are good. Rx sent to [facility]. RTC in four weeks with labs the week before a1c, met c, cbc, and lipid. Ref to endo clinic.

## 2016-11-09 NOTE — Patient Instructions (Signed)
Labs week before a1c, cbc, met c, and lipid. RTC four weeks. Ref to endo clinic 

## 2016-11-15 ENCOUNTER — Inpatient Hospital Stay
Admission: EM | Admit: 2016-11-15 | Discharge: 2016-11-16 | DRG: 639 | Disposition: A | Discharge status: Home / Self Care | Payer: Medicaid - Out of State | Attending: Internal Medicine | Admitting: Internal Medicine

## 2016-11-15 DIAGNOSIS — E860 Dehydration: Secondary | ICD-10-CM | POA: Diagnosis present

## 2016-11-15 DIAGNOSIS — E1010 Type 1 diabetes mellitus with ketoacidosis without coma: Principal | ICD-10-CM | POA: Diagnosis present

## 2016-11-15 DIAGNOSIS — R112 Nausea with vomiting, unspecified: Secondary | ICD-10-CM

## 2016-11-15 DIAGNOSIS — Z833 Family history of diabetes mellitus: Secondary | ICD-10-CM

## 2016-11-15 DIAGNOSIS — Z23 Encounter for immunization: Secondary | ICD-10-CM

## 2016-11-15 DIAGNOSIS — E109 Type 1 diabetes mellitus without complications: Secondary | ICD-10-CM

## 2016-11-15 DIAGNOSIS — Z794 Long term (current) use of insulin: Secondary | ICD-10-CM

## 2016-11-15 DIAGNOSIS — E1110 Type 2 diabetes mellitus with ketoacidosis without coma: Secondary | ICD-10-CM | POA: Diagnosis present

## 2016-11-15 LAB — COMPREHENSIVE METABOLIC PANEL
ALBUMIN: 3.5 g/dL (ref 3.5–5.0)
ALK PHOS: 112 U/L (ref 38–126)
ALT: 38 U/L (ref 17–63)
ALT: 36 U/L (ref 17–63)
AST: 41 U/L (ref 15–41)
AST: 38 U/L (ref 15–41)
Albumin: 3.8 g/dL (ref 3.5–5.0)
Alkaline Phosphatase: 103 U/L (ref 38–126)
Anion gap: 16 — ABNORMAL HIGH (ref 5–15)
Anion gap: 12 (ref 5–15)
BILIRUBIN TOTAL: 2 mg/dL — AB (ref 0.3–1.2)
BUN: 9 mg/dL (ref 6–20)
BUN: 7 mg/dL (ref 6–20)
CHLORIDE: 112 mmol/L — AB (ref 101–111)
CHLORIDE: 108 mmol/L (ref 101–111)
CO2: 12 mmol/L — ABNORMAL LOW (ref 22–32)
CO2: 16 mmol/L — AB (ref 22–32)
CREATININE: 1.01 mg/dL (ref 0.61–1.24)
Calcium: 8.6 mg/dL — ABNORMAL LOW (ref 8.9–10.3)
Calcium: 8.5 mg/dL — ABNORMAL LOW (ref 8.9–10.3)
Creatinine, Ser: 0.84 mg/dL (ref 0.61–1.24)
GFR calc Af Amer: >60 mL/min (ref >60)
GFR calc non Af Amer: >60 mL/min (ref >60)
GLUCOSE: 195 mg/dL — AB (ref 65–99)
Glucose, Bld: 193 mg/dL — ABNORMAL HIGH (ref 65–99)
POTASSIUM: 3.6 mmol/L (ref 3.5–5.1)
Potassium: 4 mmol/L (ref 3.5–5.1)
SODIUM: 136 mmol/L (ref 135–145)
Sodium: 140 mmol/L (ref 135–145)
Total Bilirubin: 1.5 mg/dL — ABNORMAL HIGH (ref 0.3–1.2)
Total Protein: 6.7 g/dL (ref 6.5–8.1)
Total Protein: 6.4 g/dL — ABNORMAL LOW (ref 6.5–8.1)

## 2016-11-15 LAB — GLUCOSE, CAPILLARY
GLUCOSE-CAPILLARY: 232 mg/dL — AB (ref 65–99)
GLUCOSE-CAPILLARY: 338 mg/dL — AB (ref 65–99)
GLUCOSE-CAPILLARY: 114 mg/dL — AB (ref 65–99)
GLUCOSE-CAPILLARY: 151 mg/dL — AB (ref 65–99)
GLUCOSE-CAPILLARY: 194 mg/dL — AB (ref 65–99)
GLUCOSE-CAPILLARY: 111 mg/dL — AB (ref 65–99)
GLUCOSE-CAPILLARY: 201 mg/dL — AB (ref 65–99)
GLUCOSE-CAPILLARY: 145 mg/dL — AB (ref 65–99)
GLUCOSE-CAPILLARY: 137 mg/dL — AB (ref 65–99)
GLUCOSE-CAPILLARY: 239 mg/dL — AB (ref 65–99)
Glucose-Capillary: 362 mg/dL — ABNORMAL HIGH (ref 65–99)
Glucose-Capillary: 283 mg/dL — ABNORMAL HIGH (ref 65–99)
Glucose-Capillary: 159 mg/dL — ABNORMAL HIGH (ref 65–99)
Glucose-Capillary: 149 mg/dL — ABNORMAL HIGH (ref 65–99)
Glucose-Capillary: 158 mg/dL — ABNORMAL HIGH (ref 65–99)
Glucose-Capillary: 105 mg/dL — ABNORMAL HIGH (ref 65–99)
Glucose-Capillary: 172 mg/dL — ABNORMAL HIGH (ref 65–99)
Glucose-Capillary: 211 mg/dL — ABNORMAL HIGH (ref 65–99)
Glucose-Capillary: 135 mg/dL — ABNORMAL HIGH (ref 65–99)
Glucose-Capillary: 274 mg/dL — ABNORMAL HIGH (ref 65–99)

## 2016-11-15 LAB — CBC
HCT: 53 % — ABNORMAL HIGH (ref 40.0–52.0)
HEMOGLOBIN: 18.1 g/dL — AB (ref 13.0–18.0)
MCH: 32.9 pg (ref 26.0–34.0)
MCHC: 34.2 g/dL (ref 32.0–36.0)
MCV: 96.2 fL (ref 80.0–100.0)
PLATELETS: 337 10*3/uL (ref 150–440)
RBC: 5.51 MIL/uL (ref 4.40–5.90)
RDW: 14.3 % (ref 11.5–14.5)
WBC: 6.2 10*3/uL (ref 3.8–10.6)

## 2016-11-15 LAB — BASIC METABOLIC PANEL
ANION GAP: 22 — AB (ref 5–15)
ANION GAP: 11 (ref 5–15)
Anion gap: 12 (ref 5–15)
BUN: 11 mg/dL (ref 6–20)
BUN: 7 mg/dL (ref 6–20)
BUN: 7 mg/dL (ref 6–20)
CALCIUM: 10 mg/dL (ref 8.9–10.3)
CALCIUM: 8.7 mg/dL — AB (ref 8.9–10.3)
CALCIUM: 8.7 mg/dL — AB (ref 8.9–10.3)
CHLORIDE: 100 mmol/L — AB (ref 101–111)
CO2: 15 mmol/L — AB (ref 22–32)
CO2: 17 mmol/L — ABNORMAL LOW (ref 22–32)
CO2: 17 mmol/L — AB (ref 22–32)
CREATININE: 0.83 mg/dL (ref 0.61–1.24)
Chloride: 109 mmol/L (ref 101–111)
Chloride: 112 mmol/L — ABNORMAL HIGH (ref 101–111)
Creatinine, Ser: 1.23 mg/dL (ref 0.61–1.24)
Creatinine, Ser: 0.9 mg/dL (ref 0.61–1.24)
GFR calc Af Amer: >60 mL/min (ref >60)
GFR calc non Af Amer: >60 mL/min (ref >60)
GFR calc non Af Amer: >60 mL/min (ref >60)
GFR calc non Af Amer: >60 mL/min (ref >60)
GLUCOSE: 126 mg/dL — AB (ref 65–99)
Glucose, Bld: 261 mg/dL — ABNORMAL HIGH (ref 65–99)
Glucose, Bld: 118 mg/dL — ABNORMAL HIGH (ref 65–99)
POTASSIUM: 3.2 mmol/L — AB (ref 3.5–5.1)
Potassium: 4.1 mmol/L (ref 3.5–5.1)
Potassium: 3.5 mmol/L (ref 3.5–5.1)
SODIUM: 137 mmol/L (ref 135–145)
SODIUM: 140 mmol/L (ref 135–145)
Sodium: 138 mmol/L (ref 135–145)

## 2016-11-15 LAB — BETA-HYDROXYBUTYRIC ACID: Beta-Hydroxybutyric Acid: 6.41 mmol/L — ABNORMAL HIGH (ref 0.05–0.27)

## 2016-11-15 LAB — MRSA PCR SCREENING: MRSA BY PCR: NEGATIVE

## 2016-11-15 MED ORDER — SODIUM CHLORIDE 0.9 % IV SOLN
INTRAVENOUS | Status: DC
  Administered 2016-11-15: 2.8 [IU]/h via INTRAVENOUS

## 2016-11-15 MED ORDER — ONDANSETRON HCL 4 MG/2ML IJ SOLN
4.0000 mg | Freq: Four times a day (QID) | INTRAMUSCULAR | Status: DC | PRN
  Administered 2016-11-15: 4 mg via INTRAVENOUS
  Filled 2016-11-15: qty 2

## 2016-11-15 MED ORDER — FAMOTIDINE IN NACL 20-0.9 MG/50ML-% IV SOLN
20.0000 mg | Freq: Once | INTRAVENOUS | Status: AC
  Administered 2016-11-15: 20 mg via INTRAVENOUS
  Filled 2016-11-15: qty 50

## 2016-11-15 MED ORDER — DEXTROSE-NACL 5-0.45 % IV SOLN
INTRAVENOUS | Status: DC
  Administered 2016-11-15 – 2016-11-16 (×3): via INTRAVENOUS

## 2016-11-15 MED ORDER — SODIUM CHLORIDE 0.9 % IV SOLN
30.0000 meq | Freq: Once | INTRAVENOUS | Status: AC
  Administered 2016-11-15: 30 meq via INTRAVENOUS
  Filled 2016-11-15: qty 15

## 2016-11-15 MED ORDER — SODIUM CHLORIDE 0.9 % IV SOLN: INTRAVENOUS | Status: DC

## 2016-11-15 MED ORDER — SODIUM CHLORIDE 0.9 % IV SOLN
INTRAVENOUS | Status: DC
  Administered 2016-11-15: 06:00:00 via INTRAVENOUS

## 2016-11-15 MED ORDER — ACETAMINOPHEN 650 MG RE SUPP: 650.0000 mg | Freq: Four times a day (QID) | RECTAL | Status: DC | PRN

## 2016-11-15 MED ORDER — ONDANSETRON HCL 4 MG/2ML IJ SOLN
INTRAMUSCULAR | Status: AC
  Administered 2016-11-15: 4 mg via INTRAVENOUS
  Filled 2016-11-15: qty 2

## 2016-11-15 MED ORDER — ACETAMINOPHEN 325 MG PO TABS: 650.0000 mg | ORAL_TABLET | Freq: Four times a day (QID) | ORAL | Status: DC | PRN

## 2016-11-15 MED ORDER — PNEUMOCOCCAL VAC POLYVALENT 25 MCG/0.5ML IJ INJ
0.5000 mL | INJECTION | INTRAMUSCULAR | Status: AC
  Administered 2016-11-16: 0.5 mL via INTRAMUSCULAR
  Filled 2016-11-15: qty 0.5

## 2016-11-15 MED ORDER — ONDANSETRON HCL 4 MG PO TABS: 4.0000 mg | ORAL_TABLET | Freq: Four times a day (QID) | ORAL | Status: DC | PRN

## 2016-11-15 MED ORDER — DEXTROSE-NACL 5-0.45 % IV SOLN: INTRAVENOUS | Status: DC

## 2016-11-15 MED ORDER — INFLUENZA VAC SPLIT QUAD 0.5 ML IM SUSY
0.5000 mL | PREFILLED_SYRINGE | INTRAMUSCULAR | Status: AC
  Administered 2016-11-16: 0.5 mL via INTRAMUSCULAR
  Filled 2016-11-15: qty 0.5

## 2016-11-15 MED ORDER — SODIUM CHLORIDE 0.9 % IV BOLUS (SEPSIS)
2000.0000 mL | Freq: Once | INTRAVENOUS | Status: AC
  Administered 2016-11-15: 2000 mL via INTRAVENOUS

## 2016-11-15 MED ORDER — ONDANSETRON HCL 4 MG/2ML IJ SOLN
4.0000 mg | Freq: Once | INTRAMUSCULAR | Status: AC
  Administered 2016-11-15: 4 mg via INTRAVENOUS

## 2016-11-15 MED ORDER — SODIUM CHLORIDE 0.9 % IV SOLN
INTRAVENOUS | Status: DC
  Filled 2016-11-15: qty 2.5

## 2016-11-15 NOTE — ED Notes (Signed)
CBG at 308am= 232 mg/dL

## 2016-11-15 NOTE — Progress Notes (Signed)
Patient seen this morning. Continue treatment for DKA Patient receives medications from medication management and reports he has been compliant with medications

## 2016-11-15 NOTE — Progress Notes (Addendum)
Inpatient Diabetes Program Recommendations  AACE/ADA: New Consensus Statement on Inpatient Glycemic Control (2015)  Target Ranges:  Prepandial:   less than 140 mg/dL      Peak postprandial:   less than 180 mg/dL (1-2 hours)      Critically ill patients:  140 - 180 mg/dL   Lab Results  Component Value Date   GLUCAP 283 (H) 11/15/2016   HGBA1C 11.9 (H) 09/04/2016    Diabetes history:     DM1 Outpatient Diabetes medications:     Lantus 45 units QHS,     Novolog 0-9 units TID with meals,     Apidra 11 units TID before meals (33 units total)  Current orders for Inpatient glycemic control:     Novolin R insulin drip  Inpatient Diabetes Program Recommendations:  Insulin - IV drip/GlucoStabilizer: Patient is currently on an insulin drip and requiring IV insulin per GlucoStabilizer. Patient will need IV insulin until acidosis clears (once CO2 is 20 or greater and Anion Gap is 10-12 or less).   IV fluids: When patient is tolerating PO intake, please consider decreasing IV fluids with dextrose (if appropriate). Once patient is ready to transition from IV to SQ insulin, please discontinue dextrose in IV fluids.  Insulin - Basal: Once acidosis is cleared and MD orders for patient to transition from IV to SQ insulin, please consider ordering Lantus 36 units 1-2 hours prior to transition off drip.  Correction (SSI): Once acidosis is cleared and MD orders for patient to transition from IV to SQ insulin, please consider ordering Novolog 0-9 units Q4H (to provide closer glucose monitoring after transitioned off IV insulin).  Insulin - Meal Coverage: Would recommend ordering meal coverage when patient is eating 50% or more.  NOTE:     Stephen PennerMarie Byrd, RN, MSN, Inpatient Diabetes Management, had spoken with patient and parents on 09/05/2016. He has had DKA 4 times since January (once in January and March due to having the flu), in October, and now. In October, Stephen Curry discussed DKA process and  clarified questions about DKA disease process for patient and his father. Also Stephen Curry discussed DKA protocol and parameters to transition off IV insulin to SQ insulin.  Stephen Curry had discussed importance of checking CBGs and maintaining good CBG control to prevent long-term and short-term complications. Stephen Curry had explained how hyperglycemia leads to damage within blood vessels which lead to the common complications seen with uncontrolled diabetes.      Spoke with bedside RN.  Patient has flat affect, not answering questions at this time.  PLAN upon this admission:     Continue to stress to the patient the importance of improving glycemic control to prevent further complications from uncontrolled diabetes and discuss impact of nutrition, exercise, stress, sickness, and medications on diabetes control.      Discuss with patient if he has followed up with the [facility].      Consult CM and SW.  Thank you,  Stephen [name], RN, MSN Diabetes Coordinator Inpatient Diabetes Program [phone] (Team Pager)

## 2016-11-15 NOTE — Care Management (Addendum)
Patient was here in October. Per CM note patient just moved to [place] with his father and at that time denied having problems paying for medications including insulin however the RNCM [facility]. I will deliver another application and have sent messages to both clinics to check patient's status. RNCM will continue to follow [facility] hours- 8-4P M-F CLOSED NEW YEARS DAY. It would be most helpful if medications/Rx were shared with RNCM so that [facility]. Received notification from Lelon MastLorrie Holt with [facility] that patient is now established with their office and saw Dr. Candelaria Stagershaplin.

## 2016-11-15 NOTE — ED Triage Notes (Signed)
Pt presents to ED with c/o DKA. Pt reports N/V and headache that started yesterday around 4pm. Pt is Type 1 diabetic, last CGB check was 123 at 1am (2 hrs PTA).

## 2016-11-15 NOTE — H&P (Signed)
[facility] [place] [facility]   PATIENT NAME: Stephen Curry    MR#:  [id number]  DATE OF BIRTH:  [birth year]  DATE OF ADMISSION:  11/15/2016  PRIMARY CARE PHYSICIAN: No primary care provider on file.   REQUESTING/REFERRING PHYSICIAN:   CHIEF COMPLAINT:   Chief Complaint  Patient presents with  . Diabetic Ketoacidosis    HISTORY OF PRESENT ILLNESS: Stephen Barryndrew Peretz  is a 20 y.o. male with a known history of Type 1 diabetes mellitus presented to the emergency room with nausea and vomiting since Sunday. Patient could not eat anything and drink any fluids because of severe vomiting. He also has generalized body aches. Patient was evaluated in the emergency room his anion gap was elevated and beta hydroxybutyrate was elevated. Patient was started on IV insulin drip for diabetic ketoacidosis. Patient says he gets bouts of ketoacidosis very frequently every year. No complaints of any chest pain, shortness of breath. No history of any headache dizziness or blurry vision.  PAST MEDICAL HISTORY:   Past Medical History:  Diagnosis Date  . Type 1 diabetes (HCC)     PAST SURGICAL HISTORY: Past Surgical History:  Procedure Laterality Date  . none      SOCIAL HISTORY:  Social History  Substance Use Topics  . Smoking status: Never Smoker  . Smokeless tobacco: Never Used  . Alcohol use No    FAMILY HISTORY:  Family History  Problem Relation Age of Onset  . Diabetes Other   . Diabetes Father     DRUG ALLERGIES: No Known Allergies  REVIEW OF SYSTEMS:   CONSTITUTIONAL: No fever, has weakness.  EYES: No blurred or double vision.  EARS, NOSE, AND THROAT: No tinnitus or ear pain.  RESPIRATORY: No cough, shortness of breath, wheezing or hemoptysis.  CARDIOVASCULAR: No chest pain, orthopnea, edema.  GASTROINTESTINAL: Has nausea, vomiting,  No diarrhea or abdominal pain.  GENITOURINARY: No dysuria, hematuria.  ENDOCRINE: Has polyuria, nocturia,  HEMATOLOGY: No  anemia, easy bruising or bleeding SKIN: No rash or lesion. MUSCULOSKELETAL: No joint pain or arthritis.   NEUROLOGIC: No tingling, numbness, weakness.  PSYCHIATRY: No anxiety or depression.   MEDICATIONS AT HOME:  Prior to Admission medications   Medication Sig Start Date End Date Taking? Authorizing Provider  insulin aspart (NOVOLOG) 100 UNIT/ML injection Inject 0-9 Units into the skin 3 (three) times daily with meals. 09/07/16  Yes Ramonita LabAruna Gouru, MD  insulin glargine (LANTUS) 100 UNIT/ML injection Inject 0.45 mLs (45 Units total) into the skin at bedtime. 11/09/16  Yes Virl Axeon C Chaplin, MD  insulin glulisine (APIDRA) 100 UNIT/ML injection Inject 0.33 mLs (33 Units total) into the skin 3 (three) times daily before meals. 11/09/16   Virl Axeon C Chaplin, MD      PHYSICAL EXAMINATION:   VITAL SIGNS: Blood pressure 128/81, pulse (!) 120, temperature 98.8 F (37.1 C), temperature source Oral, resp. rate 17, height 5\' 10"  (1.778 m), weight 72.6 kg (160 lb), SpO2 100 %.  GENERAL:  20 y.o.-year-old patient lying in the bed with no acute distress.  EYES: Pupils equal, round, reactive to light and accommodation. No scleral icterus. Extraocular muscles intact.  HEENT: Head atraumatic, normocephalic. Oropharynx dry and nasopharynx clear.  NECK:  Supple, no jugular venous distention. No thyroid enlargement, no tenderness.  LUNGS: Normal breath sounds bilaterally, no wheezing, rales,rhonchi or crepitation. No use of accessory muscles of respiration.  CARDIOVASCULAR: S1, S2 normal. No murmurs, rubs, or gallops.  ABDOMEN: Soft, nontender, nondistended. Bowel sounds present.  No organomegaly or mass.  EXTREMITIES: No pedal edema, cyanosis, or clubbing.  NEUROLOGIC: Cranial nerves II through XII are intact. Muscle strength 5/5 in all extremities. Sensation intact. Gait not checked.  PSYCHIATRIC: The patient is alert and oriented x 3.  SKIN: No obvious rash, lesion, or ulcer.   LABORATORY PANEL:   CBC  Recent  Labs Lab 11/15/16 0310  WBC 6.2  HGB 18.1*  HCT 53.0*  PLT 337  MCV 96.2  MCH 32.9  MCHC 34.2  RDW 14.3   ------------------------------------------------------------------------------------------------------------------  Chemistries   Recent Labs Lab 11/15/16 0310  NA 137  K 4.1  CL 100*  CO2 15*  GLUCOSE 261*  BUN 11  CREATININE 1.23  CALCIUM 10.0   ------------------------------------------------------------------------------------------------------------------ estimated creatinine clearance is 97.6 mL/min (by C-G formula based on SCr of 1.23 mg/dL). ------------------------------------------------------------------------------------------------------------------ No results for input(s): TSH, T4TOTAL, T3FREE, THYROIDAB in the last 72 hours.  Invalid input(s): FREET3   Coagulation profile No results for input(s): INR, PROTIME in the last 168 hours. ------------------------------------------------------------------------------------------------------------------- No results for input(s): DDIMER in the last 72 hours. -------------------------------------------------------------------------------------------------------------------  Cardiac Enzymes No results for input(s): CKMB, TROPONINI, MYOGLOBIN in the last 168 hours.  Invalid input(s): CK ------------------------------------------------------------------------------------------------------------------ Invalid input(s): POCBNP  ---------------------------------------------------------------------------------------------------------------  Urinalysis    Component Value Date/Time   COLORURINE COLORLESS (A) 09/07/2016 0345   APPEARANCEUR CLEAR (A) 09/07/2016 0345   LABSPEC 1.006 09/07/2016 0345   PHURINE 5.0 09/07/2016 0345   GLUCOSEU >500 (A) 09/07/2016 0345   HGBUR NEGATIVE 09/07/2016 0345   BILIRUBINUR NEGATIVE 09/07/2016 0345   KETONESUR NEGATIVE 09/07/2016 0345   PROTEINUR NEGATIVE 09/07/2016 0345    NITRITE NEGATIVE 09/07/2016 0345   LEUKOCYTESUR NEGATIVE 09/07/2016 0345     RADIOLOGY: No results found.  EKG: Orders placed or performed during the hospital encounter of 09/04/16  . EKG 12-Lead  . EKG 12-Lead  . ED EKG 12-Lead  . ED EKG 12-Lead  . EKG    IMPRESSION AND PLAN: 20 year old male patient with history of type 1 diabetes mellitus presented to the emergency room with nausea vomiting and generalized weakness. Admitting diagnosis 1. Diabetic ketoacidosis 2. Dehydration 3. Intractable nausea vomiting Treatment plan Admit patient to stepdown unit IV insulin drip based on protocol IV fluid hydration Antiemetics Frequent blood sugar monitoring And diabetic education.  All the records are reviewed and case discussed with ED provider. Management plans discussed with the patient, family and they are in agreement.  CODE STATUS:FULL CODE Code Status History    Date Active Date Inactive Code Status Order ID Comments User Context   09/04/2016 10:14 AM 09/07/2016  6:10 PM Full Code [id number]  Milagros LollSrikar Sudini, MD ED       TOTAL CRITICAL CARE TIME TAKING CARE OF THIS PATIENT: 54 minutes.    Ihor AustinPavan Binh Doten M.D on 11/15/2016 at 6:10 AM  Between 7am to 6pm - Pager - [phone]  After 6pm go to [email or web address] - password [email or web address] [facility]  [facility]  Office  [phone]  CC: Primary care physician; No primary care provider on file.

## 2016-11-15 NOTE — ED Provider Notes (Signed)
[facility] Emergency Department Provider Note   ____________________________________________   First MD Initiated Contact with Patient 11/15/16 [phone]     (approximate)  I have reviewed the triage vital signs and the nursing notes.   HISTORY  Chief Complaint Diabetic Ketoacidosis    HPI Stephen Curry is a 20 y.o. male who presents to the ED from home with a chief complaint of DKA. Patient is a type I diabetic, not on an insulin pump, who reports vomiting and epigastric abdominal pain which began 1-1/2 days ago. Reports last hospitalization for DKA in October. Denies associated fever, chills, chest pain, shortness of breath, diarrhea, dysuria. Denies recent travel or trauma. Nothing makes his symptoms better or worse.   Past Medical History:  Diagnosis Date  . Type 1 diabetes [employer](HCC)     Patient Active Problem List   Diagnosis Date Noted  . DKA (diabetic ketoacidoses) (HCC) 09/04/2016    History reviewed. No pertinent surgical history.  Prior to Admission medications   Medication Sig Start Date End Date Taking? Authorizing Provider  insulin aspart (NOVOLOG) 100 UNIT/ML injection Inject 0-9 Units into the skin 3 (three) times daily with meals. 09/07/16  Yes Ramonita LabAruna Gouru, MD  insulin glargine (LANTUS) 100 UNIT/ML injection Inject 0.45 mLs (45 Units total) into the skin at bedtime. 11/09/16  Yes Virl Axeon C Chaplin, MD  insulin glulisine (APIDRA) 100 UNIT/ML injection Inject 0.33 mLs (33 Units total) into the skin 3 (three) times daily before meals. 11/09/16   Virl Axeon C Chaplin, MD    Allergies Patient has no known allergies.  Family History  Problem Relation Age of Onset  . Diabetes Other   . Diabetes Father     Social History Social History  Substance Use Topics  . Smoking status: Never Smoker  . Smokeless tobacco: Never Used  . Alcohol use No    Review of Systems  Constitutional: No fever/chills. Eyes: No visual changes. ENT: No sore  throat. Cardiovascular: Denies chest pain. Respiratory: Denies shortness of breath. Gastrointestinal: Positive for abdominal pain, nausea and vomiting.  No diarrhea.  No constipation. Genitourinary: Negative for dysuria. Musculoskeletal: Negative for back pain. Skin: Negative for rash. Neurological: Negative for headaches, focal weakness or numbness.  10-point ROS otherwise negative.  ____________________________________________   PHYSICAL EXAM:  VITAL SIGNS: ED Triage Vitals  Enc Vitals Group     BP 11/15/16 0316 114/88     Pulse Rate 11/15/16 0316 98     Resp 11/15/16 0316 16     Temp 11/15/16 0316 98.8 F (37.1 C)     Temp Source 11/15/16 0316 Oral     SpO2 11/15/16 0316 99 %     Weight 11/15/16 0303 160 lb (72.6 kg)     Height 11/15/16 0303 5\' 10"  (1.778 m)     Head Circumference --      Peak Flow --      Pain Score 11/15/16 0303 8     Pain Loc --      Pain Edu? --      Excl. in GC? --     Constitutional: Alert and oriented. Ill appearing and in mild acute distress. Eyes: Conjunctivae are normal. PERRL. EOMI. Head: Atraumatic. Nose: No congestion/rhinnorhea. Mouth/Throat: Mucous membranes are moderately dry. Oropharynx non-erythematous. Neck: No stridor.   Cardiovascular: Tachycardic rate, regular rhythm. Grossly normal heart sounds.  Good peripheral circulation. Respiratory: Normal respiratory effort.  No retractions. Lungs CTAB. Gastrointestinal: Soft and mildly tender to palpation epigastrium without rebound or guarding.  No distention. No abdominal bruits. No CVA tenderness. Musculoskeletal: No lower extremity tenderness nor edema.  No joint effusions. Neurologic:  Normal speech and language. No gross focal neurologic deficits are appreciated.  Skin:  Skin is warm, dry and intact. No rash noted. Psychiatric: Mood and affect are normal. Speech and behavior are normal.  ____________________________________________   LABS (all labs ordered are listed, but only  abnormal results are displayed)  Labs Reviewed  BASIC METABOLIC PANEL - Abnormal; Notable for the following:       Result Value   Chloride 100 (*)    CO2 15 (*)    Glucose, Bld 261 (*)    Anion gap 22 (*)    All other components within normal limits  CBC - Abnormal; Notable for the following:    Hemoglobin 18.1 (*)    HCT 53.0 (*)    All other components within normal limits  GLUCOSE, CAPILLARY - Abnormal; Notable for the following:    Glucose-Capillary 232 (*)    All other components within normal limits  BLOOD GAS, VENOUS - Abnormal; Notable for the following:    pCO2, Ven 37 (*)    Bicarbonate 14.1 (*)    Acid-base deficit 13.3 (*)    All other components within normal limits  BETA-HYDROXYBUTYRIC ACID - Abnormal; Notable for the following:    Beta-Hydroxybutyric Acid 6.41 (*)    All other components within normal limits  URINALYSIS, COMPLETE (UACMP) WITH MICROSCOPIC  CBG MONITORING, ED   ____________________________________________  EKG  None ____________________________________________  RADIOLOGY  None ____________________________________________   PROCEDURES  Procedure(s) performed: None  Procedures  Critical Care performed: Yes, see critical care note(s)  CRITICAL CARE Performed by: Irean HongSUNG,JADE J   Total critical care time: 30 minutes  Critical care time was exclusive of separately billable procedures and treating other patients.  Critical care was necessary to treat or prevent imminent or life-threatening deterioration.  Critical care was time spent personally by me on the following activities: development of treatment plan with patient and/or surrogate as well as nursing, discussions with consultants, evaluation of patient's response to treatment, examination of patient, obtaining history from patient or surrogate, ordering and performing treatments and interventions, ordering and review of laboratory studies, ordering and review of radiographic  studies, pulse oximetry and re-evaluation of patient's condition. ____________________________________________   INITIAL IMPRESSION / ASSESSMENT AND PLAN / ED COURSE  Pertinent labs & imaging results that were available during my care of the patient were reviewed by me and considered in my medical decision making (see chart for details).  20 year old male who is a type I diabetic who presents in DKA. Will place patient on DKA protocol and discuss with bolus to evaluate patient in the emergency department for admission.  Clinical Course      ____________________________________________   FINAL CLINICAL IMPRESSION(S) / ED DIAGNOSES  Final diagnoses:  Diabetic ketoacidosis without coma associated with type 1 diabetes mellitus (HCC)  Nausea and vomiting, intractability of vomiting not specified, unspecified vomiting type      NEW MEDICATIONS STARTED DURING THIS VISIT:  New Prescriptions   No medications on file     Note:  This document was prepared using Dragon voice recognition software and may include unintentional dictation errors.    Irean HongJade J Sung, MD 11/15/16 [phone]

## 2016-11-16 LAB — GLUCOSE, CAPILLARY
GLUCOSE-CAPILLARY: 135 mg/dL — AB (ref 65–99)
GLUCOSE-CAPILLARY: 190 mg/dL — AB (ref 65–99)
GLUCOSE-CAPILLARY: 274 mg/dL — AB (ref 65–99)
GLUCOSE-CAPILLARY: 276 mg/dL — AB (ref 65–99)
Glucose-Capillary: 177 mg/dL — ABNORMAL HIGH (ref 65–99)
Glucose-Capillary: 143 mg/dL — ABNORMAL HIGH (ref 65–99)
Glucose-Capillary: 148 mg/dL — ABNORMAL HIGH (ref 65–99)
Glucose-Capillary: 212 mg/dL — ABNORMAL HIGH (ref 65–99)
Glucose-Capillary: 246 mg/dL — ABNORMAL HIGH (ref 65–99)
Glucose-Capillary: 155 mg/dL — ABNORMAL HIGH (ref 65–99)
Glucose-Capillary: 136 mg/dL — ABNORMAL HIGH (ref 65–99)
Glucose-Capillary: 227 mg/dL — ABNORMAL HIGH (ref 65–99)

## 2016-11-16 LAB — BASIC METABOLIC PANEL
ANION GAP: 10 (ref 5–15)
ANION GAP: 9 (ref 5–15)
ANION GAP: 10 (ref 5–15)
BUN: 6 mg/dL (ref 6–20)
BUN: 6 mg/dL (ref 6–20)
BUN: 8 mg/dL (ref 6–20)
CALCIUM: 8.6 mg/dL — AB (ref 8.9–10.3)
CALCIUM: 8.9 mg/dL (ref 8.9–10.3)
CALCIUM: 8.6 mg/dL — AB (ref 8.9–10.3)
CO2: 19 mmol/L — AB (ref 22–32)
CO2: 20 mmol/L — AB (ref 22–32)
CO2: 18 mmol/L — AB (ref 22–32)
Chloride: 109 mmol/L (ref 101–111)
Chloride: 109 mmol/L (ref 101–111)
Chloride: 108 mmol/L (ref 101–111)
Creatinine, Ser: 0.83 mg/dL (ref 0.61–1.24)
Creatinine, Ser: 0.81 mg/dL (ref 0.61–1.24)
Creatinine, Ser: 0.89 mg/dL (ref 0.61–1.24)
GFR calc Af Amer: >60 mL/min (ref >60)
GFR calc Af Amer: >60 mL/min (ref >60)
GFR calc Af Amer: >60 mL/min (ref >60)
GFR calc non Af Amer: >60 mL/min (ref >60)
GFR calc non Af Amer: >60 mL/min (ref >60)
GFR calc non Af Amer: >60 mL/min (ref >60)
GLUCOSE: 158 mg/dL — AB (ref 65–99)
GLUCOSE: 111 mg/dL — AB (ref 65–99)
GLUCOSE: 210 mg/dL — AB (ref 65–99)
POTASSIUM: 3.5 mmol/L (ref 3.5–5.1)
Potassium: 3.1 mmol/L — ABNORMAL LOW (ref 3.5–5.1)
Potassium: 3.2 mmol/L — ABNORMAL LOW (ref 3.5–5.1)
Sodium: 138 mmol/L (ref 135–145)
Sodium: 138 mmol/L (ref 135–145)
Sodium: 136 mmol/L (ref 135–145)

## 2016-11-16 LAB — CBC
HEMATOCRIT: 42.2 % (ref 40.0–52.0)
Hemoglobin: 14.6 g/dL (ref 13.0–18.0)
MCH: 33 pg (ref 26.0–34.0)
MCHC: 34.6 g/dL (ref 32.0–36.0)
MCV: 95.3 fL (ref 80.0–100.0)
Platelets: 228 10*3/uL (ref 150–440)
RBC: 4.43 MIL/uL (ref 4.40–5.90)
RDW: 14.4 % (ref 11.5–14.5)
WBC: 3.5 10*3/uL — ABNORMAL LOW (ref 3.8–10.6)

## 2016-11-16 MED ORDER — INSULIN ASPART 100 UNIT/ML ~~LOC~~ SOLN: 0.0000 [IU] | Freq: Every day | SUBCUTANEOUS | Status: DC

## 2016-11-16 MED ORDER — INSULIN GLARGINE 100 UNIT/ML ~~LOC~~ SOLN
45.0000 [IU] | Freq: Every day | SUBCUTANEOUS | Status: DC
  Filled 2016-11-16: qty 0.45

## 2016-11-16 MED ORDER — INSULIN ASPART 100 UNIT/ML ~~LOC~~ SOLN
0.0000 [IU] | Freq: Three times a day (TID) | SUBCUTANEOUS | Status: DC
  Administered 2016-11-16: 1 [IU] via SUBCUTANEOUS
  Administered 2016-11-16: 3 [IU] via SUBCUTANEOUS
  Filled 2016-11-16: qty 1
  Filled 2016-11-16: qty 3

## 2016-11-16 MED ORDER — INSULIN GLULISINE 100 UNIT/ML IJ SOLN: 33.0000 [IU] | Freq: Three times a day (TID) | INTRAMUSCULAR | 11 refills | Status: DC

## 2016-11-16 MED ORDER — INSULIN GLARGINE 100 UNIT/ML ~~LOC~~ SOLN: 45.0000 [IU] | Freq: Every day | SUBCUTANEOUS | 11 refills | Status: DC

## 2016-11-16 MED ORDER — INSULIN ASPART 100 UNIT/ML ~~LOC~~ SOLN: 0.0000 [IU] | Freq: Three times a day (TID) | SUBCUTANEOUS | 5 refills | Status: DC

## 2016-11-16 MED ORDER — INSULIN GLARGINE 100 UNIT/ML ~~LOC~~ SOLN
36.0000 [IU] | Freq: Every day | SUBCUTANEOUS | Status: DC
  Administered 2016-11-16: 36 [IU] via SUBCUTANEOUS
  Filled 2016-11-16: qty 0.36

## 2016-11-16 NOTE — Care Management (Signed)
Message sent to Medications Management of medications needed and that patient has discharged from hospital.

## 2016-11-16 NOTE — Progress Notes (Signed)
Chaplain rounded the unit to provide a compassionate presence and support to the patient. Patient sitting up in bed stated he felt better today than yesterday. Jefm PettyChaplain Dawnette Mione [phone]

## 2016-11-16 NOTE — Progress Notes (Signed)
Labs reassuring, transition off iv insulin

## 2016-11-16 NOTE — Progress Notes (Signed)
IV d/c'd, removed from telemetry, CCMD contacted re: patient d/c. D/C instructions reviewed w/ pt. He verbalized his understanding of strict medication and diet compliance; to go straight to medication management to pick up his insulin rx's. Pt signed d/c instruction receipt. Pt refused wheelchair. Walked patient to visitor's entrance where his ride was waiting on him.

## 2016-11-16 NOTE — Discharge Summary (Signed)
[facility] NAME: Stephen Curry    MR#:  [id number]  DATE OF BIRTH:  [birth year]  DATE OF ADMISSION:  11/15/2016 ADMITTING PHYSICIAN: Saundra Shelling, MD  DATE OF DISCHARGE: 11/16/16  PRIMARY CARE PHYSIC[facility]    ADMISSION DIAGNOSIS:  Diabetic ketoacidosis without coma associated with type 1 diabetes mellitus ([place]) [E10.10] Nausea and vomiting, intractability of vomiting not specified, unspecified vomiting type [R11.2]  DISCHARGE DIAGNOSIS:  Active Problems:   DKA (diabetic ketoacidoses) ([place])   SECONDARY DIAGNOSIS:   Past Medical History:  Diagnosis Date  . Type 1 diabetes [facility])     HOSPITAL COURSE:  Stephen Curry  is a 20 y.o. male admitted 11/15/2016 with chief complaint Diabetic Ketoacidosis . Please see H&P performed by Saundra Shelling, MD for further information. Patient presented with nausea/vomiting - was in DKA - placed on insulin drip. Successfully transitioned off without issues. Met with CM in regards to obtaining medicine  DISCHARGE CONDITIONS:   stable  CONSULTS OBTAINED:    DRUG ALLERGIES:  No Known Allergies  DISCHARGE MEDICATIONS:   Current Discharge Medication List    CONTINUE these medications which have CHANGED   Details  insulin aspart (NOVOLOG) 100 UNIT/ML injection Inject 0-9 Units into the skin 3 (three) times daily with meals. Qty: 10 mL, Refills: 5    insulin glargine (LANTUS) 100 UNIT/ML injection Inject 0.45 mLs (45 Units total) into the skin at bedtime. Qty: 10 mL, Refills: 11   Associated Diagnoses: Type 1 diabetes mellitus without complication (HCC)    insulin glulisine (APIDRA) 100 UNIT/ML injection Inject 0.33 mLs (33 Units total) into the skin 3 (three) times daily before meals. Qty: 10 mL, Refills: 11   Associated Diagnoses: Type 1 diabetes mellitus with ketoacidosis without coma ([place])         DISCHARGE INSTRUCTIONS:    DIET:  Diabetic diet  DISCHARGE  CONDITION:  Stable  ACTIVITY:  Activity as tolerated  OXYGEN:  Home Oxygen: No.   Oxygen Delivery: room air  DISCHARGE LOCATION:  home   If you experience worsening of your admission symptoms, develop shortness of breath, life threatening emergency, suicidal or homicidal thoughts you must seek medical attention immediately by calling 911 or calling your MD immediately  if symptoms less severe.  You Must read complete instructions/literature along with all the possible adverse reactions/side effects for all the Medicines you take and that have been prescribed to you. Take any new Medicines after you have completely understood and accpet all the possible adverse reactions/side effects.   Please note  You were cared for by a hospitalist during your hospital stay. If you have any questions about your discharge medications or the care you received while you were in the hospital after you are discharged, you can call the unit and asked to speak with the hospitalist on call if the hospitalist that took care of you is not available. Once you are discharged, your primary care physician will handle any further medical issues. Please note that NO REFILLS for any discharge medications will be authorized once you are discharged, as it is imperative that you return to your primary care physician (or establish a relationship with a primary care physician if you do not have one) for your aftercare needs so that they can reassess your need for medications and monitor your lab values.    On the day of Discharge:   VITAL SIGNS:  Blood pressure 125/79, pulse (!) 101,  temperature 98 F (36.7 C), temperature source Oral, resp. rate 17, height 5' 10"  (1.778 m), weight 72.6 kg (160 lb), SpO2 96 %.  I/O:   Intake/Output Summary (Last 24 hours) at 11/16/16 1136 Last data filed at 11/16/16 1000  Gross per 24 hour  Intake          5771.33 ml  Output             1525 ml  Net          4246.33 ml     PHYSICAL EXAMINATION:  GENERAL:  20 y.o.-year-old patient lying in the bed with no acute distress.  EYES: Pupils equal, round, reactive to light and accommodation. No scleral icterus. Extraocular muscles intact.  HEENT: Head atraumatic, normocephalic. Oropharynx and nasopharynx clear.  NECK:  Supple, no jugular venous distention. No thyroid enlargement, no tenderness.  LUNGS: Normal breath sounds bilaterally, no wheezing, rales,rhonchi or crepitation. No use of accessory muscles of respiration.  CARDIOVASCULAR: S1, S2 normal. No murmurs, rubs, or gallops.  ABDOMEN: Soft, non-tender, non-distended. Bowel sounds present. No organomegaly or mass.  EXTREMITIES: No pedal edema, cyanosis, or clubbing.  NEUROLOGIC: Cranial nerves II through XII are intact. Muscle strength 5/5 in all extremities. Sensation intact. Gait not checked.  PSYCHIATRIC: The patient is alert and oriented x 3.  SKIN: No obvious rash, lesion, or ulcer.   DATA REVIEW:   CBC  Recent Labs Lab 11/16/16 0253  WBC 3.5*  HGB 14.6  HCT 42.2  PLT 228    Chemistries   Recent Labs Lab 11/15/16 1459  11/16/16 0905  NA 136  < > 138  K 3.6  < > 3.2*  CL 108  < > 109  CO2 16*  < > 20*  GLUCOSE 195*  < > 111*  BUN 7  < > 6  CREATININE 0.84  < > 0.81  CALCIUM 8.5*  < > 8.9  AST 38  --   --   ALT 36  --   --   ALKPHOS 103  --   --   BILITOT 1.5*  --   --   < > = values in this interval not displayed.  Cardiac Enzymes No results for input(s): TROPONINI in the last 168 hours.  Microbiology Results  Results for orders placed or performed during the hospital encounter of 11/15/16  MRSA PCR Screening     Status: None   Collection Time: 11/15/16  6:32 AM  Result Value Ref Range Status   MRSA by PCR NEGATIVE NEGATIVE Final    Comment:        The GeneXpert MRSA Assay (FDA approved for NASAL specimens only), is one component of a comprehensive MRSA colonization surveillance program. It is not intended to  diagnose MRSA infection nor to guide or monitor treatment for MRSA infections.     RADIOLOGY:  No results found.   Management plans discussed with the patient, family and they are in agreement.  CODE STATUS:     Code Status Orders        Start     Ordered   11/15/16 0614  Full code  Continuous     11/15/16 0613    Code Status History    Date Active Date Inactive Code Status Order ID Comments User Context   09/04/2016 10:14 AM 09/07/2016  6:10 PM Full Code [id number]  Hillary Bow, MD ED      TOTAL TIME TAKING CARE OF THIS PATIENT: 33 minutes.    Saben Donigan,  Karenann Cai.D on 11/16/2016 at 11:36 AM  Between 7am to 6pm - Pager - [phone]  After 6pm go to [email or web address] - [facility] [place]  Office  [phone]  CC: Primary care physician; No primary care provider on file.

## 2016-11-19 LAB — BLOOD GAS, VENOUS
Acid-base deficit: 13.3 mmol/L — ABNORMAL HIGH (ref 0.0–2.0)
Bicarbonate: 14.1 mmol/L — ABNORMAL LOW (ref 20.0–28.0)
PCO2 VEN: 37 mmHg — AB (ref 44.0–60.0)
Patient temperature: 37
pH, Ven: 7.19 — CL (ref 7.250–7.430)

## 2016-11-30 ENCOUNTER — Other Ambulatory Visit: Payer: Self-pay

## 2016-11-30 DIAGNOSIS — Z0000 Encounter for general adult medical examination without abnormal findings: Secondary | ICD-10-CM

## 2016-12-01 LAB — CBC WITH DIFFERENTIAL
BASOS ABS: 0 10*3/uL (ref 0.0–0.2)
BASOS: 1 %
EOS (ABSOLUTE): 0.1 10*3/uL (ref 0.0–0.4)
Eos: 2 %
Hematocrit: 43.4 % (ref 37.5–51.0)
Hemoglobin: 14.1 g/dL (ref 13.0–17.7)
Immature Grans (Abs): 0 10*3/uL (ref 0.0–0.1)
Immature Granulocytes: 1 %
LYMPHS ABS: 1.3 10*3/uL (ref 0.7–3.1)
LYMPHS: 34 %
MCH: 31.4 pg (ref 26.6–33.0)
MCHC: 32.5 g/dL (ref 31.5–35.7)
MCV: 97 fL (ref 79–97)
Monocytes Absolute: 0.3 10*3/uL (ref 0.1–0.9)
Monocytes: 7 %
NEUTROS ABS: 2.1 10*3/uL (ref 1.4–7.0)
NEUTROS PCT: 55 %
RBC: 4.49 x10E6/uL (ref 4.14–5.80)
RDW: 14.2 % (ref 12.3–15.4)
WBC: 3.8 10*3/uL (ref 3.4–10.8)

## 2016-12-01 LAB — HEMOGLOBIN A1C
Est. average glucose Bld gHb Est-mCnc: 275 mg/dL
HEMOGLOBIN A1C: 11.2 % — AB (ref 4.8–5.6)

## 2016-12-01 LAB — COMPREHENSIVE METABOLIC PANEL
A/G RATIO: 1.5 (ref 1.2–2.2)
ALT: 79 IU/L — ABNORMAL HIGH (ref 0–44)
AST: 156 IU/L — AB (ref 0–40)
Albumin: 4 g/dL (ref 3.5–5.5)
Alkaline Phosphatase: 138 IU/L — ABNORMAL HIGH (ref 39–117)
BUN/Creatinine Ratio: 18 (ref 9–20)
BUN: 13 mg/dL (ref 6–20)
Bilirubin Total: 0.3 mg/dL (ref 0.0–1.2)
CALCIUM: 9.6 mg/dL (ref 8.7–10.2)
CO2: 27 mmol/L (ref 18–29)
Chloride: 95 mmol/L — ABNORMAL LOW (ref 96–106)
Creatinine, Ser: 0.71 mg/dL — ABNORMAL LOW (ref 0.76–1.27)
GFR calc Af Amer: 155 mL/min/{1.73_m2} (ref >59)
GFR, EST NON AFRICAN AMERICAN: 134 mL/min/{1.73_m2} (ref >59)
GLUCOSE: 238 mg/dL — AB (ref 65–99)
Globulin, Total: 2.7 g/dL (ref 1.5–4.5)
POTASSIUM: 5.6 mmol/L — AB (ref 3.5–5.2)
Sodium: 139 mmol/L (ref 134–144)
TOTAL PROTEIN: 6.7 g/dL (ref 6.0–8.5)

## 2016-12-01 LAB — LIPID PANEL
CHOL/HDL RATIO: 4.3 ratio (ref 0.0–5.0)
Cholesterol, Total: 240 mg/dL — ABNORMAL HIGH (ref 100–199)
HDL: 56 mg/dL (ref >39)
LDL CALC: 141 mg/dL — AB (ref 0–99)
TRIGLYCERIDES: 213 mg/dL — AB (ref 0–149)
VLDL Cholesterol Cal: 43 mg/dL — ABNORMAL HIGH (ref 5–40)

## 2016-12-06 ENCOUNTER — Telehealth: Payer: Self-pay

## 2016-12-06 NOTE — Telephone Encounter (Signed)
Called pt to reschedule due to weather on 12/07/2016. Left msg for pt to call back.

## 2016-12-07 ENCOUNTER — Ambulatory Visit: Payer: Self-pay | Admitting: Internal Medicine

## 2016-12-13 ENCOUNTER — Other Ambulatory Visit: Payer: Self-pay

## 2016-12-13 NOTE — Telephone Encounter (Signed)
[facility] Apidra placed for provider to sign.

## 2016-12-16 NOTE — Telephone Encounter (Signed)
Placed signed application/script in [facility] folder for pickup. 

## 2016-12-20 ENCOUNTER — Ambulatory Visit: Payer: Self-pay | Admitting: Adult Health Nurse Practitioner

## 2016-12-20 VITALS — BP 119/86 | HR 78 | Temp 98.1°F | Wt 170.0 lb

## 2016-12-20 DIAGNOSIS — E119 Type 2 diabetes mellitus without complications: Secondary | ICD-10-CM | POA: Insufficient documentation

## 2016-12-20 DIAGNOSIS — R945 Abnormal results of liver function studies: Secondary | ICD-10-CM

## 2016-12-20 DIAGNOSIS — R7989 Other specified abnormal findings of blood chemistry: Secondary | ICD-10-CM | POA: Insufficient documentation

## 2016-12-20 DIAGNOSIS — E782 Mixed hyperlipidemia: Secondary | ICD-10-CM

## 2016-12-20 LAB — GLUCOSE, POCT (MANUAL RESULT ENTRY): POC GLUCOSE: 598 mg/dL — AB (ref 70–99)

## 2016-12-20 NOTE — Progress Notes (Signed)
  Patient: Stephen Curry Male    DOB: May 17, 1996   21 y.o.   MRN: [id number] Visit Date: 12/20/2016  Today's Provider: Jacelyn Pieah Doles-Johnson, NP   Chief Complaint  Patient presents with  . Follow-up  . Diabetes   Subjective:    Diabetes      Type 1 DM Taking 45 units of lantus.  Humalog 30/120 ratio. 1 unit for every 30 points above 120.   CBG today 598. States he does not feel hyperglycemic.  Reports sugars have been running around 150- states he checked his sugar a little bit ago and it was not high.  Plans to go to Endo clinic on 2.27.    Pt states that he was taking Aspirins daily for headaches a couple of weeks ago- Denies being a drinker.  Denies N/V/D or abdominal pain.     No Known Allergies Previous Medications   INSULIN ASPART (NOVOLOG) 100 UNIT/ML INJECTION    Inject 0-9 Units into the skin 3 (three) times daily with meals.   INSULIN GLARGINE (LANTUS) 100 UNIT/ML INJECTION    Inject 0.45 mLs (45 Units total) into the skin at bedtime.   INSULIN GLULISINE (APIDRA) 100 UNIT/ML INJECTION    Inject 0.33 mLs (33 Units total) into the skin 3 (three) times daily before meals.    Review of Systems  All other systems reviewed and are negative.   Social History  Substance Use Topics  . Smoking status: Never Smoker  . Smokeless tobacco: Never Used  . Alcohol use No   Objective:   BP 119/86   Pulse 78   Temp 98.1 F (36.7 C)   Wt 170 lb (77.1 kg)   BMI 24.39 kg/m   Physical Exam  Constitutional: He appears well-developed and well-nourished.  Eyes: Pupils are equal, round, and reactive to light.  Cardiovascular: Normal rate, regular rhythm and normal heart sounds.   Pulmonary/Chest: Effort normal and breath sounds normal.  Neurological: He is alert.  Skin: Skin is warm and dry.  Vitals reviewed.       Assessment & Plan:           DM1:  Keep current regimen.  FU with Endo clinic.   HLD:  Monitor.  Encourage low cholesterol diet.  Exercise.    Elevated LFTs Avoid Tylenol.  Repeat LFTs today.    Jacelyn Pieah Doles-Johnson, NP   [facility] [place] [place]

## 2016-12-21 LAB — HEPATIC FUNCTION PANEL
ALBUMIN: 4.6 g/dL (ref 3.5–5.5)
ALK PHOS: 147 IU/L — AB (ref 39–117)
ALT: 78 IU/L — AB (ref 0–44)
AST: 104 IU/L — AB (ref 0–40)
BILIRUBIN TOTAL: 0.6 mg/dL (ref 0.0–1.2)
Bilirubin, Direct: 0.16 mg/dL (ref 0.00–0.40)
Total Protein: 7.1 g/dL (ref 6.0–8.5)

## 2017-01-10 ENCOUNTER — Ambulatory Visit: Payer: Self-pay | Admitting: Internal Medicine

## 2017-01-10 VITALS — BP 115/77 | Wt 172.6 lb

## 2017-01-10 DIAGNOSIS — E109 Type 1 diabetes mellitus without complications: Secondary | ICD-10-CM

## 2017-01-10 DIAGNOSIS — R740 Nonspecific elevation of levels of transaminase and lactic acid dehydrogenase [LDH]: Secondary | ICD-10-CM

## 2017-01-10 DIAGNOSIS — R7401 Elevation of levels of liver transaminase levels: Secondary | ICD-10-CM

## 2017-01-10 DIAGNOSIS — H9203 Otalgia, bilateral: Secondary | ICD-10-CM

## 2017-01-10 MED ORDER — INSULIN GLARGINE 100 UNIT/ML ~~LOC~~ SOLN: 40.0000 [IU] | Freq: Every day | SUBCUTANEOUS | 11 refills | Status: DC

## 2017-01-10 NOTE — Progress Notes (Signed)
Assessment:   Type 1 DM complicated with previous episodes of DKA      Plan:    1.  Rx changes: Continue Lantus at 40 units. Instruct to take prandial insulin before meals rather than after. Continue carbohydrates count 1:10 grams. Could test carb ratio by checking blood sugar 2 hours after meals.  Can use sliding scale after the meal for correction.   2.  Rolin Barry saw endocrinologist on 01/10/17. Topics that were discussed tonight include diet, exercise, taking medications, blood glucose pattern recognition (i.e. Checking 2 hours before and after eating), meaning of laboratory results. Expect blood sugar to rise 30-60 with the correct ratio of insulin unit: carbohydrate.   3. Watch basal insulin. If low in morning, back off on Lantus slightly.   4. For insomnia, improve sleep hygeine. Avoid tylenol PM and aspirin until LFT return. May try Benadryl   5. Transaminitis: previously suspected related to Acetaminphen. Not taking tylenol anymore. Recheck today.   5. Follow up: I recommend patient follow-up with me at 1 month. Record glucose numbers and bring them to clinic. Consider switching to a pen for future use w/ Apidra.    Subjective:    Stephen Curry is a 21 y.o. male who is seen in follow up for type 1 diabetes and was diagnosed in 2012. Was diagnosed on admission with a blood sugar of >400. DKA episode and diabetic coma in 2015. Went into DKA in October and December from the flu.   DM1 Checking blood sugars 1-4 times per day. Self-reported average is 100-150. Highest number in the past month is approximately 400. Is in the 300-400 range 3 or 4 times a month.   Sliding scale insulin glargine (has been a sliding scale since diagnosis). Was switched from novoLOG to Apidra in Dec because [employer] was out of Novolog.   Denies low blood sugars in general .One low this month at 63. Diaphoresis and nausea when it happened. Drinks juice or eats something when low.    Acknowledges some  neuropathy and polyuria during hyperglycemia. Acknowledges that polyuria is a first symptom of hyperglycemia and checks blood sugars.  Current regimen: 40 units of Lantus at night; sliding scale of Apidra but averages 30 units.  Reports mild symptoms of hypoglycemia. These include fasting events.   Denies polydypsia and weight loss Denies chest pain, shortness of breath, edema, foot lesions or ulcers.  Denies severe hypoglycemia or admission to hospital for DKA.  Cardiovascular risk factors: some activity. Jogs 1/2 mile x2 week.   Eye Exam: 2016   EAR PAIN Reports a sharp bilateral pain in the ear drum that presents unilaterally has persisted for a couple of months. R ear drum more frequent; L ear drum more intense. Frequent headaches. Denies changes in vision. No inflammation on exam.   INSOMNIA   Reports some insominia since this Thursday. . Denies using alcohol, caffeine (except for the occasional soda), poor sleep hygeine.   Family History Maternal history of migraines.  Paternal history of T2DM.  2 Half siblings -- no endocrinological conditions.    The patient's history was reviewed and updated as appropriate.  No Known Allergies   Current Outpatient Prescriptions:  .  insulin aspart (NOVOLOG) 100 UNIT/ML injection, Inject 0-9 Units into the skin 3 (three) times daily with meals. (Patient not taking: Reported on 12/20/2016), Disp: 10 mL, Rfl: 5 .  insulin glargine (LANTUS) 100 UNIT/ML injection, Inject 0.45 mLs (45 Units total) into the skin at bedtime., Disp: 10 mL,  Rfl: 11 .  insulin glulisine (APIDRA) 100 UNIT/ML injection, Inject 0.33 mLs (33 Units total) into the skin 3 (three) times daily before meals., Disp: 10 mL, Rfl: 11  Social History   Social History  . Marital status: Single    Spouse name: N/A  . Number of children: N/A  . Years of education: N/A   Occupational History  . unemployed    Social History Main Topics  . Smoking status: Never Smoker   . Smokeless tobacco: Never Used  . Alcohol use No  . Drug use: No  . Sexual activity: Not on file   Other Topics Concern  . Not on file   Social History Narrative  . No narrative on file    Family History  Problem Relation Age of Onset  . Diabetes Other   . Diabetes Father     Review of Systems A 12 point review of systems was negative except for pertinent items noted in the HPI.   Objective:     Wt Readings from Last 3 Encounters:  12/20/16 170 lb (77.1 kg)  11/15/16 160 lb (72.6 kg)  11/09/16 160 lb (72.6 kg)   There were no vitals taken for this visit.  General appearance:  alert and appears stated age, in no distress      Eyes:  conjunctivae/corneas clear. EOM's intact. Sclera anicteric. Negative lid lag or proptosis  Ear Normal, no inflammation.   Neck: no adenopathy, supple, symmetrical, trachea midline  Thyroid:  Mobile, normal size, no palpable nodule  Lung: clear to auscultation bilaterally  Heart:  regular rate and rhythm, S1, S2 normal, no murmur, click, rub or gallop  Abdomen:  bowel sounds normal; negative bruits  Extremities: extremities normal, atraumatic, no cyanosis or edema     Pulses: DP & PT 2+ and symmetric.  Neuro: normal without focal findings, mental status, speech normal, alert and oriented x3, reflexes normal and symmetric and gait normal.   Feet: negative lesions or ulcers, 10 gram monofilament intact bilateral plantar surface     Lab Review No components found for: A1C Glucose (mg/dL)  Date Value  16/08/9603 238 (H)   Glucose, Bld (mg/dL)  Date Value  54/07/8118 210 (H)  11/16/2016 111 (H)  11/16/2016 158 (H)   CO2 (mmol/L)  Date Value  11/30/2016 27  11/16/2016 18 (L)  11/16/2016 20 (L)   BUN (mg/dL)  Date Value  14/78/2956 13  11/16/2016 8  11/16/2016 6  11/16/2016 6   Creatinine, Ser (mg/dL)  Date Value  21/30/8657 0.71 (L)  11/16/2016 0.89  11/16/2016 0.81   No components found for: LDL,  LDLCALC,   LDLDIRECT Lab Results  Component Value Date   NA 139 11/30/2016   K 5.6 (H) 11/30/2016   CL 95 (L) 11/30/2016   CO2 27 11/30/2016   BUN 13 11/30/2016   CREATININE 0.71 (L) 11/30/2016   GFRAA 155 11/30/2016   GFRNONAA 134 11/30/2016   CALCIUM 9.6 11/30/2016   ALBUMIN 4.6 12/20/2016     DIABETES MELLITUS RESULTS: Lab Results  Component Value Date   HGBA1C 11.2 (H) 11/30/2016   HGBA1C 11.9 (H) 09/04/2016   Lab Results  Component Value Date   LDLCALC 141 (H) 11/30/2016   CREATININE 0.71 (L) 11/30/2016   Lab Results  Component Value Date   CHOL 240 (H) 11/30/2016   Lab Results  Component Value Date   LDLCALC 141 (H) 11/30/2016   No components found for: CHOLLLDLDIRECT No components found for: MICROALB/CR Lab Results  Component Value Date   GFRAA 155 11/30/2016   GFRNONAA 134 11/30/2016

## 2017-02-07 ENCOUNTER — Ambulatory Visit: Payer: Self-pay | Admitting: Endocrinology

## 2017-02-07 DIAGNOSIS — E109 Type 1 diabetes mellitus without complications: Secondary | ICD-10-CM

## 2017-02-07 DIAGNOSIS — E1010 Type 1 diabetes mellitus with ketoacidosis without coma: Secondary | ICD-10-CM

## 2017-02-07 MED ORDER — INSULIN GLULISINE 100 UNIT/ML IJ SOLN: INTRAMUSCULAR | 11 refills | Status: AC

## 2017-02-07 MED ORDER — INSULIN GLARGINE 100 UNIT/ML ~~LOC~~ SOLN: 40.0000 [IU] | Freq: Every day | SUBCUTANEOUS | 1 refills | Status: AC

## 2017-02-07 NOTE — Progress Notes (Signed)
Patient's primary care provider: No primary care provider on file.   Stephen Curry returns for follow-up of poorly controlled type 1 diabetes.  Last month we switched him from taking insulin after meals counting mealtime carbohydrates and checking pre-meal insulin and carbs and taking insulin before meals. Patient reports no problem with this strategy. Says BGs generally in 100's. No lows. Denies any problem with this meal-time preprandial insulin.   Denies hypoglycemia (even during exercise).  Acknowledges exercise -- some jogging.   Assessment and Plan     Other problems include:  Patient Active Problem List   Diagnosis Date Noted  . Diabetes mellitus without complication (HCC) 12/20/2016  . Elevated LFTs 12/20/2016  . Mixed hyperlipidemia 12/20/2016  . DKA (diabetic ketoacidoses) (HCC) 09/04/2016    His current medications include: Current Outpatient Prescriptions  Medication Sig Dispense Refill  . diphenhydrAMINE (BENADRYL) 25 MG tablet Take 25 mg by mouth at bedtime as needed.    . insulin glargine (LANTUS) 100 UNIT/ML injection Inject 0.4 mLs (40 Units total) into the skin at bedtime. (Patient taking differently: Inject 40 Units into the skin daily. ) 10 mL 11  . insulin glulisine (APIDRA) 100 UNIT/ML injection Inject 0.33 mLs (33 Units total) into the skin 3 (three) times daily before meals. (Patient taking differently: Inject 10 Units into the skin 3 (three) times daily before meals. On SSI) 10 mL 11   No current facility-administered medications for this visit.       Exam:  BP 118/79   Pulse 93   Wt 172 lb 12.8 oz (78.4 kg)   BMI 24.79 kg/m  Constitutional: Alert, oriented, in NAD    Recent labs:  Results for orders placed or performed in visit on 12/20/16  Hepatic function panel  Result Value Ref Range   Total Protein 7.1 6.0 - 8.5 g/dL   Albumin 4.6 3.5 - 5.5 g/dL   Bilirubin Total 0.6 0.0 - 1.2 mg/dL   Bilirubin, Direct 2.350.16 0.00 - 0.40 mg/dL   Alkaline Phosphatase 147 (H) 39 - 117 IU/L   AST 104 (H) 0 - 40 IU/L   ALT 78 (H) 0 - 44 IU/L  POCT Glucose (CBG)  Result Value Ref Range   POC Glucose 598 (A) 70 - 99 mg/dl    Assessment and Plan:    His diabetes seems to be doing better. A1C down 1% with no DKA.  Will continue current regimen for now. Patient about to go work at [employer] for 4 months, so will try to get insulin for 4 month supply to take with him.

## 2017-02-09 ENCOUNTER — Telehealth: Payer: Self-pay | Admitting: Pharmacist

## 2017-02-09 LAB — GLUCOSE, POCT (MANUAL RESULT ENTRY): POC Glucose: 204 mg/dl — AB (ref 70–99)

## 2017-02-09 LAB — POCT GLYCOSYLATED HEMOGLOBIN (HGB A1C): Hemoglobin A1C: 10.6

## 2017-02-09 NOTE — Addendum Note (Signed)
Addended by: Smiley HousemanARTER, Tamarius Rosenfield D on: 02/09/2017 01:36 PM   Modules accepted: Orders

## 2017-02-09 NOTE — Telephone Encounter (Signed)
[facility], and patient was seen by provider. Patient will be working at [employer] May 1 - Sept 3 and would need a 4 month supply of insulin to take with him, Lorina RabonKeri requested that I call [employer] to see if they would make an exception, I also asked if I sent a letter explaining patient situation if they would reconsider, according to the representative they will only ship a 3 month supply of insulin, I will let Keri know. I have started a New Med application (page 1) for Lantus Vials Inject 40 units under the skin every day and [facility] Candelaria Stagershaplin to sign, also the dose changed on Apidra Vials Use before meals per insulin/carb ratio-Max daily dose 70 units and printed a refill request with Dose Change for Dr. Candelaria Stagershaplin to sign.

## 2017-02-10 ENCOUNTER — Telehealth: Payer: Self-pay | Admitting: Pharmacist

## 2017-02-10 NOTE — Telephone Encounter (Signed)
Received note from Keri to make the below changes so we can get a 4 month supply of meds for patient. Novolog Vial Inject 70 units daily-use before meals per insulin/carb ratio #9 vials this replaces Apidra. Levemir Vial Inject 40 units under the skin daily #5 vials this replaces Lantus. Printed application will [facility], Lorina RabonKeri is calling patient to come and sign his portion. We will need a Medicaid denial since patient has No income, Lorina RabonKeri also letting patient know this. Noting all this in [facility].

## 2017-02-21 ENCOUNTER — Ambulatory Visit: Payer: Self-pay | Admitting: Urology

## 2017-02-21 VITALS — BP 115/79 | HR 119 | Temp 98.0°F | Wt 161.2 lb

## 2017-02-21 DIAGNOSIS — E119 Type 2 diabetes mellitus without complications: Secondary | ICD-10-CM

## 2017-02-21 LAB — GLUCOSE, POCT (MANUAL RESULT ENTRY): POC Glucose: 100 mg/dl — AB (ref 70–99)

## 2017-02-21 NOTE — Progress Notes (Signed)
Patient's primary care provider: No primary care provider on file.   Eligio Angert returns for follow-up of poorly controlled type 1 diabetes.  Patient will need a 4 month supply of his insulin.  We cannot get a 4 month supply of medications, Apidra and Lantus through [employer].  We will need to switch to Novolog Vial Inject 70 U - use before meals per insulin/carb ratios and Levemir Vial inject 40 units under the skin daily.    [facility] has been trying to contact the patient as he needs to sign his part for Novo PAP forms and see social services regarding Medicaid denial letter.   Saw Dr. Cory Roughen last month and was switched from taking insulin after meals counting mealtime carbohydrates and checking pre-meal insulin and carbs and taking insulin before meals. Patient reports no problem with this strategy. Says BGs generally in 100's. No lows. Denies any problem with this meal-time preprandial insulin.   Denies hypoglycemia (even during exercise).  Acknowledges exercise -- some jogging.   He will be leaving for [facility] in May and returning in September.   Assessment and Plan     Other problems include:  Patient Active Problem List   Diagnosis Date Noted  . Diabetes mellitus without complication (HCC) 12/20/2016  . Elevated LFTs 12/20/2016  . Mixed hyperlipidemia 12/20/2016  . DKA (diabetic ketoacidoses) (HCC) 09/04/2016    His current medications include: Current Outpatient Prescriptions  Medication Sig Dispense Refill  . diphenhydrAMINE (BENADRYL) 25 MG tablet Take 25 mg by mouth at bedtime as needed.    . insulin glargine (LANTUS) 100 UNIT/ML injection Inject 0.4 mLs (40 Units total) into the skin daily. (Patient not taking: Reported on 02/21/2017) 50 mL 1  . insulin glulisine (APIDRA) 100 UNIT/ML injection Use before meals per insulin/carb ratio. Total daily dose up to 70 units (Patient not taking: Reported on 02/21/2017) 90 mL 11   No current facility-administered medications for this  visit.       Exam:  BP 115/79   Pulse (!) 119   Temp 98 F (36.7 C)   Wt 161 lb 3.2 oz (73.1 kg)   BMI 23.13 kg/m  Constitutional: Alert, oriented, in NAD    Recent labs:  Results for orders placed or performed in visit on 02/21/17  POCT Glucose (CBG)  Result Value Ref Range   POC Glucose 100 (A) 70 - 99 mg/dl    Assessment and Plan:    His diabetes seems to be doing better. A1C down 1% with no DKA.  Regimen will need to be changed somewhat to get the medications he needs for four months.   Patient about to go work at [employer] for 4 months, so will try to get insulin for 4 month supply to take with him.- this is in the works.

## 2017-02-22 ENCOUNTER — Telehealth: Payer: Self-pay | Admitting: Pharmacist

## 2017-02-22 ENCOUNTER — Telehealth: Payer: Self-pay

## 2017-02-22 NOTE — Telephone Encounter (Signed)
Dr. Cory Roughen saw Kolter Reaver Tuesday. She ordered his insulin in a 4 month supply and did not want to change the regimen. [employer] will not ship more >3 months, Drinda Butts verified.   21 year old male with multiple ED admissions for DKA. He is going to [facility] for 4 months to work. He lives with his parents and this is temporary. He did this last year as well.   Recommend changing to Lantus to Levemir and Apidra to Novolog. Novo Nordisk will supply 4 months at a time.    He has an appointment coming up with [facility] on 4/3 at 6pm.  However, I don't feel that we can wait until then to start new applications. I will send the applications over to [facility], if you could please attach this note so that the provider has the background information. We will try to expedite the application process.   I have left a message for the patient to call me,   He needs to sign the Novo PAP forms and go to Medicaid to be screened.   Novo nordisk is now requiring a Medicaid denial for folks in [place] with 0 income.   Dr. Candelaria Stagers was willing to sign application but would prefer the patient be given a 5 month supply verses 4 months.  Dr. Candelaria Stagers is concerned about patient running out of medications while out of state.

## 2017-02-22 NOTE — Telephone Encounter (Signed)
02/22/17 Lorrie from [facility] brought provider sign form to our office today, I called patient, asked if he could come by our office and sign his portion of forms for [place] to order his insulin, also asked if he had a [place] Medicaid denial. He will come by today. Patient did come by and sign forms and left Medicaid denial. I have faxed today to [employer] for enrollment: Novolog Vials  (replaces Apidra)  Inject 70 units daily use before meal per insulin/carb ratio. Levemir Vials  (replaces Lantus) Inject 40 units under the skin daily.

## 2017-03-21 ENCOUNTER — Ambulatory Visit: Payer: Self-pay

## 2017-03-21 ENCOUNTER — Other Ambulatory Visit: Payer: Self-pay

## 2017-05-16 ENCOUNTER — Telehealth: Payer: Self-pay

## 2017-05-16 NOTE — Telephone Encounter (Signed)
[facility] Levemir & Novolog placed for provider to sign.

## 2017-06-12 ENCOUNTER — Telehealth: Payer: Self-pay | Admitting: Pharmacist

## 2017-06-12 NOTE — Telephone Encounter (Signed)
06/12/17 Faxed refill request to [employer] for Levemir Vials Inject 40 units daily & Novolog Vials Inject 70 units daily-use before meals per insulin/card ratio. Forde RadonAJ

## 2017-06-21 ENCOUNTER — Emergency Department: Payer: Self-pay

## 2017-06-21 ENCOUNTER — Encounter: Payer: Self-pay | Admitting: Emergency Medicine

## 2017-06-21 ENCOUNTER — Emergency Department
Admission: EM | Admit: 2017-06-21 | Discharge: 2017-06-21 | Disposition: A | Discharge status: Home / Self Care | Payer: Self-pay | Attending: Emergency Medicine | Admitting: Emergency Medicine

## 2017-06-21 DIAGNOSIS — R200 Anesthesia of skin: Secondary | ICD-10-CM | POA: Insufficient documentation

## 2017-06-21 DIAGNOSIS — M79605 Pain in left leg: Secondary | ICD-10-CM | POA: Insufficient documentation

## 2017-06-21 DIAGNOSIS — E109 Type 1 diabetes mellitus without complications: Secondary | ICD-10-CM | POA: Insufficient documentation

## 2017-06-21 DIAGNOSIS — Z794 Long term (current) use of insulin: Secondary | ICD-10-CM | POA: Insufficient documentation

## 2017-06-21 DIAGNOSIS — Z79899 Other long term (current) drug therapy: Secondary | ICD-10-CM | POA: Insufficient documentation

## 2017-06-21 DIAGNOSIS — M79604 Pain in right leg: Secondary | ICD-10-CM | POA: Insufficient documentation

## 2017-06-21 LAB — COMPREHENSIVE METABOLIC PANEL
ALBUMIN: 4.4 g/dL (ref 3.5–5.0)
ALT: 13 U/L — ABNORMAL LOW (ref 17–63)
ANION GAP: 9 (ref 5–15)
AST: 17 U/L (ref 15–41)
Alkaline Phosphatase: 71 U/L (ref 38–126)
BUN: 8 mg/dL (ref 6–20)
CHLORIDE: 99 mmol/L — AB (ref 101–111)
CO2: 29 mmol/L (ref 22–32)
Calcium: 9.6 mg/dL (ref 8.9–10.3)
Creatinine, Ser: 0.79 mg/dL (ref 0.61–1.24)
GFR calc non Af Amer: >60 mL/min (ref >60)
GLUCOSE: 97 mg/dL (ref 65–99)
Potassium: 3.3 mmol/L — ABNORMAL LOW (ref 3.5–5.1)
SODIUM: 137 mmol/L (ref 135–145)
Total Bilirubin: 0.7 mg/dL (ref 0.3–1.2)
Total Protein: 7.6 g/dL (ref 6.5–8.1)

## 2017-06-21 LAB — CBC WITH DIFFERENTIAL/PLATELET
Basophils Absolute: 0 10*3/uL (ref 0–0.1)
Basophils Relative: 1 %
Eosinophils Absolute: 0.1 10*3/uL (ref 0–0.7)
Eosinophils Relative: 3 %
HCT: 48.6 % (ref 40.0–52.0)
HEMOGLOBIN: 17.2 g/dL (ref 13.0–18.0)
LYMPHS ABS: 1.8 10*3/uL (ref 1.0–3.6)
LYMPHS PCT: 39 %
MCH: 30.2 pg (ref 26.0–34.0)
MCHC: 35.3 g/dL (ref 32.0–36.0)
MCV: 85.5 fL (ref 80.0–100.0)
MONOS PCT: 8 %
Monocytes Absolute: 0.4 10*3/uL (ref 0.2–1.0)
NEUTROS ABS: 2.3 10*3/uL (ref 1.4–6.5)
NEUTROS PCT: 49 %
Platelets: 280 10*3/uL (ref 150–440)
RBC: 5.68 MIL/uL (ref 4.40–5.90)
RDW: 12.3 % (ref 11.5–14.5)
WBC: 4.6 10*3/uL (ref 3.8–10.6)

## 2017-06-21 MED ORDER — ACETAMINOPHEN 500 MG PO TABS
1000.0000 mg | ORAL_TABLET | Freq: Once | ORAL | Status: AC
  Administered 2017-06-21: 1000 mg via ORAL
  Filled 2017-06-21: qty 2

## 2017-06-21 MED ORDER — IBUPROFEN 600 MG PO TABS
600.0000 mg | ORAL_TABLET | Freq: Once | ORAL | Status: AC
  Administered 2017-06-21: 600 mg via ORAL
  Filled 2017-06-21: qty 1

## 2017-06-21 MED ORDER — IBUPROFEN 600 MG PO TABS: 600.0000 mg | ORAL_TABLET | Freq: Three times a day (TID) | ORAL | 0 refills | Status: DC | PRN

## 2017-06-21 NOTE — ED Notes (Signed)
Patient returned from Ultrasound. 

## 2017-06-21 NOTE — ED Provider Notes (Signed)
[facility] Emergency Department Provider Note  ____________________________________________   First MD Initiated Contact with Patient 06/21/17 [phone]     (approximate)  I have reviewed the triage vital signs and the nursing notes.   HISTORY  Chief Complaint Leg Pain   HPI Stephen Curry is a 21 y.o. male who comes to the emergency department with 2 hours of numbness to his left lower extremity. He's had pain in bilateral lower extremities ever since [place] the bus from [place] 6 weeks ago. He has no personal or family history of deep vein thrombus or pulmonary embolism. No chest pain or shortness of breath. His pain is not exertional. Nothing seems to make it better or worse. He was asleep last night and he said he was awoken by numbness that began in his knee all the way down.   Past Medical History:  Diagnosis Date  . Type 1 diabetes [facility](HCC)     Patient Active Problem List   Diagnosis Date Noted  . Diabetes mellitus without complication (HCC) 12/20/2016  . Elevated LFTs 12/20/2016  . Mixed hyperlipidemia 12/20/2016  . DKA (diabetic ketoacidoses) (HCC) 09/04/2016    Past Surgical History:  Procedure Laterality Date  . none      Prior to Admission medications   Medication Sig Start Date End Date Taking? Authorizing Provider  diphenhydrAMINE (BENADRYL) 25 MG tablet Take 25 mg by mouth at bedtime as needed.    [provider]  ibuprofen (ADVIL,MOTRIN) 600 MG tablet Take 1 tablet (600 mg total) by mouth every 8 (eight) hours as needed. 06/21/17   Merrily Brittleifenbark, Abdulraheem Pineo, MD  insulin glargine (LANTUS) 100 UNIT/ML injection Inject 0.4 mLs (40 Units total) into the skin daily. Patient not taking: Reported on 02/21/2017 02/07/17   Grace BushyKirkman, Marian S, MD  insulin glulisine (APIDRA) 100 UNIT/ML injection Use before meals per insulin/carb ratio. Total daily dose up to 70 units Patient not taking: Reported on 02/21/2017 02/07/17   Grace BushyKirkman, Marian S,  MD    Allergies Patient has no known allergies.  Family History  Problem Relation Age of Onset  . Diabetes Other   . Diabetes Father     Social History Social History  Substance Use Topics  . Smoking status: Never Smoker  . Smokeless tobacco: Never Used  . Alcohol use No    Review of Systems Constitutional: No fever/chills ENT: No sore throat. Cardiovascular: Denies chest pain. Respiratory: Denies shortness of breath. Gastrointestinal: No abdominal pain.  No nausea, no vomiting.  No diarrhea.  No constipation. Musculoskeletal: Negative for back pain. Neurological: Negative for headaches   ____________________________________________   PHYSICAL EXAM:  VITAL SIGNS: ED Triage Vitals  Enc Vitals Group     BP 06/21/17 0638 (!) 136/96     Pulse Rate 06/21/17 0638 (!) 110     Resp 06/21/17 0638 18     Temp 06/21/17 0638 97.8 F (36.6 C)     Temp Source 06/21/17 0638 Oral     SpO2 06/21/17 0638 99 %     Weight 06/21/17 0635 170 lb (77.1 kg)     Height 06/21/17 0635 5\' 10"  (1.778 m)     Head Circumference --      Peak Flow --      Pain Score 06/21/17 0635 8     Pain Loc --      Pain Edu? --      Excl. in GC? --     Constitutional: Alert and oriented 4 well  appearing nontoxic no diaphoresis speaks in full clear sentences Head: Atraumatic. Nose: No congestion/rhinnorhea. Mouth/Throat: No trismus Neck: No stridor.   Cardiovascular: Tachycardic regular rhythm no murmurs Blood pressure and right upper extremity 129/101 blood pressure and left upper extremity 121/100 Blood pressure and right lower extremity 149/114 blood pressure and left lower extremity 156/104 ABI on right 1.15 ABI on left 1.28 Skin is warm and dry to posterior cells pedis pulses bilaterally compartments are soft Respiratory: Normal respiratory effort.  No retractions. Musculoskeletal:   Normal speech and language. No gross focal neurologic deficits are appreciated. No tenderness over medial  malleolus or lateral malleolus or for 6 cm proximal No tenderness over navicular or fifth metatarsal 2+ dorsalis pedis pulse Patient can fire extensor hallucis longus, extensor digitorum longus, flexor hallucis longus, flexor digitorum longus, tibialis anterior, and gastrocnemius Sensation intact to light touch to sural, saphenous, deep peroneal, superficial peroneal, and tibial nerve  Skin:  Skin is warm, dry and intact. No rash noted.    ____________________________________________  LABS (all labs ordered are listed, but only abnormal results are displayed)  Labs Reviewed  COMPREHENSIVE METABOLIC PANEL - Abnormal; Notable for the following:       Result Value   Potassium 3.3 (*)    Chloride 99 (*)    ALT 13 (*)    All other components within normal limits  CBC WITH DIFFERENTIAL/PLATELET  Unremarkable __________________________________________  EKG   ____________________________________________  RADIOLOGY  Ultrasound negative for DVT ____________________________________________   PROCEDURES  Procedure(s) performed: no  Procedures  Critical Care performed: no  Observation: no ____________________________________________   INITIAL IMPRESSION / ASSESSMENT AND PLAN / ED COURSE  Pertinent labs & imaging results that were available during my care of the patient were reviewed by me and considered in my medical decision making (see chart for details).  The patient has strong distal pulses normal ABI and an ultrasound negative for DVT. Unclear etiology of his chronic leg pain but it is not in a dermatomal distribution. I will refer him to primary care for further evaluation. He does not require a refill of his insulin at this time. He is discharged home in improved condition.      ____________________________________________   FINAL CLINICAL IMPRESSION(S) / ED DIAGNOSES  Final diagnoses:  Pain in both lower extremities  Leg numbness      NEW MEDICATIONS  STARTED DURING THIS VISIT:  Discharge Medication List as of 06/21/2017  9:13 AM    START taking these medications   Details  ibuprofen (ADVIL,MOTRIN) 600 MG tablet Take 1 tablet (600 mg total) by mouth every 8 (eight) hours as needed., Starting Wed 06/21/2017, Print         Note:  This document was prepared using Dragon voice recognition software and may include unintentional dictation errors.      Merrily Brittleifenbark, Eliana Lueth, MD 06/21/17 [phone]

## 2017-06-21 NOTE — Discharge Instructions (Signed)
Fortunately today your blood work and ultrasound were normal. Please make an appointment to establish care with a primary care physician for reexamination. Return to the emergency department for any concerns whatsoever.  It was a pleasure to take care of you today, and thank you for coming to our emergency department.  If you have any questions or concerns before leaving please ask the nurse to grab me and I'm more than happy to go through your aftercare instructions again.  If you were prescribed any opioid pain medication today such as Norco, Vicodin, Percocet, morphine, hydrocodone, or oxycodone please make sure you do not drive when you are taking this medication as it can alter your ability to drive safely.  If you have any concerns once you are home that you are not improving or are in fact getting worse before you can make it to your follow-up appointment, please do not hesitate to call 911 and come back for further evaluation.  Merrily BrittleNeil Neeta Storey, MD  Results for orders placed or performed during the hospital encounter of 06/21/17  Comprehensive metabolic panel  Result Value Ref Range   Sodium 137 135 - 145 mmol/L   Potassium 3.3 (L) 3.5 - 5.1 mmol/L   Chloride 99 (L) 101 - 111 mmol/L   CO2 29 22 - 32 mmol/L   Glucose, Bld 97 65 - 99 mg/dL   BUN 8 6 - 20 mg/dL   Creatinine, Ser 1.610.79 0.61 - 1.24 mg/dL   Calcium 9.6 8.9 - 09.610.3 mg/dL   Total Protein 7.6 6.5 - 8.1 g/dL   Albumin 4.4 3.5 - 5.0 g/dL   AST 17 15 - 41 U/L   ALT 13 (L) 17 - 63 U/L   Alkaline Phosphatase 71 38 - 126 U/L   Total Bilirubin 0.7 0.3 - 1.2 mg/dL   GFR calc non Af Amer >60 >60 mL/min   GFR calc Af Amer >60 >60 mL/min   Anion gap 9 5 - 15  CBC with Differential  Result Value Ref Range   WBC 4.6 3.8 - 10.6 K/uL   RBC 5.68 4.40 - 5.90 MIL/uL   Hemoglobin 17.2 13.0 - 18.0 g/dL   HCT 04.548.6 40.940.0 - 81.152.0 %   MCV 85.5 80.0 - 100.0 fL   MCH 30.2 26.0 - 34.0 pg   MCHC 35.3 32.0 - 36.0 g/dL   RDW 91.412.3 78.211.5 - 95.614.5 %   Platelets 280 150 - 440 K/uL   Neutrophils Relative % 49 %   Neutro Abs 2.3 1.4 - 6.5 K/uL   Lymphocytes Relative 39 %   Lymphs Abs 1.8 1.0 - 3.6 K/uL   Monocytes Relative 8 %   Monocytes Absolute 0.4 0.2 - 1.0 K/uL   Eosinophils Relative 3 %   Eosinophils Absolute 0.1 0 - 0.7 K/uL   Basophils Relative 1 %   Basophils Absolute 0.0 0 - 0.1 K/uL   [place] Venous Img Lower Bilateral  Result Date: 06/21/2017 CLINICAL DATA:  Bilateral lower extremity swelling and pain for the past month EXAM: BILATERAL LOWER EXTREMITY VENOUS DOPPLER ULTRASOUND TECHNIQUE: Gray-scale sonography with graded compression, as well as color Doppler and duplex ultrasound were performed to evaluate the lower extremity deep venous systems from the level of the common femoral vein and including the common femoral, femoral, profunda femoral, popliteal and calf veins including the posterior tibial, peroneal and gastrocnemius veins when visible. The superficial great saphenous vein was also interrogated. Spectral Doppler was utilized to evaluate flow at rest and with distal  augmentation maneuvers in the common femoral, femoral and popliteal veins. COMPARISON:  None in PACs FINDINGS: RIGHT LOWER EXTREMITY Common Femoral Vein: No evidence of thrombus. Normal compressibility, respiratory phasicity and response to augmentation. Saphenofemoral Junction: No evidence of thrombus. Normal compressibility and flow on color Doppler imaging. Profunda Femoral Vein: No evidence of thrombus. Normal compressibility and flow on color Doppler imaging. Femoral Vein: No evidence of thrombus. Normal compressibility, respiratory phasicity and response to augmentation. Popliteal Vein: No evidence of thrombus. Normal compressibility, respiratory phasicity and response to augmentation. Calf Veins: No evidence of thrombus. Normal compressibility and flow on color Doppler imaging. Superficial Great Saphenous Vein: No evidence of thrombus. Normal compressibility and flow  on color Doppler imaging. Venous Reflux:  None. Other Findings:  None. LEFT LOWER EXTREMITY Common Femoral Vein: No evidence of thrombus. Normal compressibility, respiratory phasicity and response to augmentation. Saphenofemoral Junction: No evidence of thrombus. Normal compressibility and flow on color Doppler imaging. Profunda Femoral Vein: No evidence of thrombus. Normal compressibility and flow on color Doppler imaging. Femoral Vein: No evidence of thrombus. Normal compressibility, respiratory phasicity and response to augmentation. Popliteal Vein: No evidence of thrombus. Normal compressibility, respiratory phasicity and response to augmentation. Calf Veins: No evidence of thrombus. Normal compressibility and flow on color Doppler imaging. Superficial Great Saphenous Vein: No evidence of thrombus. Normal compressibility and flow on color Doppler imaging. Venous Reflux:  None. Other Findings:  None. IMPRESSION: No evidence of DVT within either lower extremity. Electronically Signed   By: David  [place]azil[place] M.D.   On: 06/21/2017 08:51

## 2017-06-21 NOTE — ED Triage Notes (Addendum)
Patient ambulatory to triage with steady gait, without difficulty or distress noted; pt reports bus ride from [place] here in June, since has had pain to lower legs bilat and also reports sharp pain to right ear; denies any other accomp symptoms

## 2017-06-21 NOTE — ED Notes (Addendum)
Pt in with co lower leg extremity pain since June 19th, states started as bilat hip to bilat feet pain. States for few weeks has been bilat knee pain that radiates to bilat feet. This am at 0500 states LLE became pain free and numb. States is only able to feel pressure when leg palpated. Pulses are wnl with symmetric skin color without edema noted. Pt is ambulatory states does have a slight limp due to numbness. Pt is a diabetic but states FSBS are controlled, this am was 132.

## 2017-07-06 ENCOUNTER — Emergency Department: Payer: Medicaid - Out of State

## 2017-07-06 ENCOUNTER — Encounter: Payer: Self-pay | Admitting: Emergency Medicine

## 2017-07-06 ENCOUNTER — Emergency Department
Admission: EM | Admit: 2017-07-06 | Discharge: 2017-07-06 | Disposition: A | Discharge status: Home / Self Care | Payer: Medicaid - Out of State | Attending: Emergency Medicine | Admitting: Emergency Medicine

## 2017-07-06 DIAGNOSIS — R079 Chest pain, unspecified: Secondary | ICD-10-CM | POA: Insufficient documentation

## 2017-07-06 DIAGNOSIS — Z794 Long term (current) use of insulin: Secondary | ICD-10-CM | POA: Insufficient documentation

## 2017-07-06 DIAGNOSIS — E1065 Type 1 diabetes mellitus with hyperglycemia: Secondary | ICD-10-CM | POA: Insufficient documentation

## 2017-07-06 DIAGNOSIS — R739 Hyperglycemia, unspecified: Secondary | ICD-10-CM

## 2017-07-06 LAB — BASIC METABOLIC PANEL
ANION GAP: 11 (ref 5–15)
BUN: 10 mg/dL (ref 6–20)
CALCIUM: 9.4 mg/dL (ref 8.9–10.3)
CHLORIDE: 95 mmol/L — AB (ref 101–111)
CO2: 24 mmol/L (ref 22–32)
CREATININE: 0.88 mg/dL (ref 0.61–1.24)
GFR calc non Af Amer: >60 mL/min (ref >60)
Glucose, Bld: 271 mg/dL — ABNORMAL HIGH (ref 65–99)
Potassium: 3.3 mmol/L — ABNORMAL LOW (ref 3.5–5.1)
SODIUM: 130 mmol/L — AB (ref 135–145)

## 2017-07-06 LAB — GLUCOSE, CAPILLARY: GLUCOSE-CAPILLARY: 267 mg/dL — AB (ref 65–99)

## 2017-07-06 LAB — CBC
HCT: 48.7 % (ref 40.0–52.0)
HEMOGLOBIN: 17.2 g/dL (ref 13.0–18.0)
MCH: 29.8 pg (ref 26.0–34.0)
MCHC: 35.3 g/dL (ref 32.0–36.0)
MCV: 84.4 fL (ref 80.0–100.0)
PLATELETS: 316 10*3/uL (ref 150–440)
RBC: 5.77 MIL/uL (ref 4.40–5.90)
RDW: 12.8 % (ref 11.5–14.5)
WBC: 8 10*3/uL (ref 3.8–10.6)

## 2017-07-06 LAB — TROPONIN I

## 2017-07-06 MED ORDER — DIAZEPAM 5 MG PO TABS
5.0000 mg | ORAL_TABLET | Freq: Once | ORAL | Status: AC
  Administered 2017-07-06: 5 mg via ORAL
  Filled 2017-07-06: qty 1

## 2017-07-06 MED ORDER — DIAZEPAM 5 MG PO TABS: 5.0000 mg | ORAL_TABLET | Freq: Three times a day (TID) | ORAL | 0 refills | Status: AC | PRN

## 2017-07-06 MED ORDER — INSULIN ASPART 100 UNIT/ML ~~LOC~~ SOLN
5.0000 [IU] | Freq: Once | SUBCUTANEOUS | Status: AC
  Administered 2017-07-06: 5 [IU] via SUBCUTANEOUS
  Filled 2017-07-06: qty 1

## 2017-07-06 MED ORDER — SODIUM CHLORIDE 0.9 % IV BOLUS (SEPSIS)
1000.0000 mL | Freq: Once | INTRAVENOUS | Status: AC
  Administered 2017-07-06: 1000 mL via INTRAVENOUS

## 2017-07-06 MED ORDER — IBUPROFEN 600 MG PO TABS: 600.0000 mg | ORAL_TABLET | Freq: Three times a day (TID) | ORAL | 0 refills | Status: AC | PRN

## 2017-07-06 MED ORDER — KETOROLAC TROMETHAMINE 30 MG/ML IJ SOLN
30.0000 mg | Freq: Once | INTRAMUSCULAR | Status: AC
  Administered 2017-07-06: 30 mg via INTRAVENOUS
  Filled 2017-07-06: qty 1

## 2017-07-06 NOTE — ED Triage Notes (Signed)
Pt reports central chest pain that spread to generalized chest pain that began today. Pt reports associated SOB and nausea. CBG in triage 267. Sinus tachycardia noted on EKG.

## 2017-07-06 NOTE — ED Provider Notes (Signed)
[facility] Emergency Department Provider Note       Time seen: ----------------------------------------- 8:01 PM on 07/06/2017 -----------------------------------------     I have reviewed the triage vital signs and the nursing notes.   HISTORY   Chief Complaint Chest Pain   HPI Stephen Curry is a 21 y.o. male who presents to the ED for central chest pain. Patient states pain spread around his chest today. He reports associated shortness of breath and nausea. Blood sugar was 267, he states he may have missed a dose of insulin today. He denies fevers, chills or other complaints.   Past Medical History:  Diagnosis Date  . Type 1 diabetes [facility](HCC)     Patient Active Problem List   Diagnosis Date Noted  . Diabetes mellitus without complication (HCC) 12/20/2016  . Elevated LFTs 12/20/2016  . Mixed hyperlipidemia 12/20/2016  . DKA (diabetic ketoacidoses) (HCC) 09/04/2016    Past Surgical History:  Procedure Laterality Date  . none      Allergies Patient has no known allergies.  Social History Social History  Substance Use Topics  . Smoking status: Never Smoker  . Smokeless tobacco: Never Used  . Alcohol use No    Review of Systems Constitutional: Negative for fever. Eyes: Negative for vision changes ENT:  Negative for congestion, sore throat Cardiovascular: Positive for chest pain Respiratory: Negative for shortness of breath. Gastrointestinal: Negative for abdominal pain, vomiting and diarrhea. Genitourinary: Negative for dysuria. Musculoskeletal: Negative for back pain. Skin: Negative for rash. Neurological: Negative for headaches, focal weakness or numbness.  All systems negative/normal/unremarkable except as stated in the HPI  ____________________________________________   PHYSICAL EXAM:  VITAL SIGNS: ED Triage Vitals  Enc Vitals Group     BP 07/06/17 1848 109/86     Pulse Rate 07/06/17 1848 (!) 118     Resp 07/06/17 1848  18     Temp 07/06/17 1848 98.9 F (37.2 C)     Temp Source 07/06/17 1848 Oral     SpO2 07/06/17 1848 97 %     Weight 07/06/17 1848 170 lb (77.1 kg)     Height 07/06/17 1848 5\' 10"  (1.778 m)     Head Circumference --      Peak Flow --      Pain Score 07/06/17 1847 8     Pain Loc --      Pain Edu? --      Excl. in GC? --    Constitutional: Alert and oriented.  Eyes: Conjunctivae are normal. Normal extraocular movements. ENT   Head: Normocephalic and atraumatic.   Nose: No congestion/rhinnorhea.   Mouth/Throat: Mucous membranes are moist.   Neck: No stridor. Cardiovascular: Normal rate, regular rhythm. No murmurs, rubs, or gallops. Respiratory: Normal respiratory effort without tachypnea nor retractions. Breath sounds are clear and equal bilaterally. No wheezes/rales/rhonchi. Gastrointestinal: Soft and nontender. Normal bowel sounds Musculoskeletal: Nontender with normal range of motion in extremities. No lower extremity tenderness nor edema. Neurologic:  Normal speech and language. No gross focal neurologic deficits are appreciated.  Skin:  Skin is warm, dry and intact. No rash noted. Psychiatric: Mood and affect are normal. Speech and behavior are normal.  ____________________________________________  EKG: Interpreted by me. Sinus tachycardia with a rate of 122 bpm, normal PR interval, normal QRS, normal QT.  ____________________________________________  ED COURSE:  Pertinent labs & imaging results that were available during my care of the patient were reviewed by me and considered in my medical decision making (see chart for  details). Patient presents for chest pain, we will assess with labs and imaging as indicated.   Procedures ____________________________________________   LABS (pertinent positives/negatives)  Labs Reviewed  BASIC METABOLIC PANEL - Abnormal; Notable for the following:       Result Value   Sodium 130 (*)    Potassium 3.3 (*)    Chloride  95 (*)    Glucose, Bld 271 (*)    All other components within normal limits  GLUCOSE, CAPILLARY - Abnormal; Notable for the following:    Glucose-Capillary 267 (*)    All other components within normal limits  CBC  TROPONIN I    RADIOLOGY  Chest x-ray is normal  ____________________________________________  FINAL ASSESSMENT AND PLAN  Chest pain  Plan: Patient's labs and imaging were dictated above. Patient had presented for chest pain which appears to be musculoskeletal in origin. He was also given an additional dose of insulin for his hyperglycemia. He is stable for outpatient follow-up.   Emily Filbert, MD   Note: This note was generated in part or whole with voice recognition software. Voice recognition is usually quite accurate but there are transcription errors that can and very often do occur. I apologize for any typographical errors that were not detected and corrected.     Emily Filbert, MD 07/06/17 2104

## 2017-07-06 NOTE — ED Notes (Signed)
Patient verbalizes understanding of d/c instructions and follow-up. VS stable and pain controlled per patient.  Patient in NAD at time of d/c and denies further concerns regarding this visit. Patient stable at the time of departure from the unit, departing unit by the safest and most appropriate manner per that patients condition and limitations. Patient advised to return to the ED at any time for emergent concerns, or for new/worsening symptoms.   Patient ambulated to mom's room who is also a patient.

## 2017-07-11 ENCOUNTER — Ambulatory Visit: Payer: Self-pay | Admitting: Endocrinology

## 2017-07-11 VITALS — BP 113/76 | HR 109 | Temp 98.5°F | Ht 70.0 in | Wt 150.1 lb

## 2017-07-11 DIAGNOSIS — M79671 Pain in right foot: Secondary | ICD-10-CM

## 2017-07-11 DIAGNOSIS — M79605 Pain in left leg: Secondary | ICD-10-CM

## 2017-07-11 DIAGNOSIS — M79604 Pain in right leg: Secondary | ICD-10-CM

## 2017-07-11 DIAGNOSIS — M79672 Pain in left foot: Secondary | ICD-10-CM

## 2017-07-11 DIAGNOSIS — E119 Type 2 diabetes mellitus without complications: Secondary | ICD-10-CM

## 2017-07-11 LAB — GLUCOSE, POCT (MANUAL RESULT ENTRY): POC Glucose: 191 mg/dl — AB (ref 70–99)

## 2017-07-11 MED ORDER — GABAPENTIN 100 MG PO CAPS: 100.0000 mg | ORAL_CAPSULE | Freq: Three times a day (TID) | ORAL | 11 refills | Status: DC

## 2017-07-11 NOTE — Progress Notes (Unsigned)
Assessment and Plan:    1. Diabetes mellitus without complication ([place]) L9J machine malfunctioned at clinic tonight. Need to follow-up with [facility] to get an accurate HbA1c. Will continue medications as prescribed and f/u in one month with full A1c.  2. Bilateral Leg Pain Ordered ESR and PT/INR to check for inflammation. We would like Stephen Curry to see a neurologist but he is not insured. Will talk to [facility] about charity care.  Will start gabapentin    Subjective Patient's primary care provider: Patient, No Pcp Per  Stephen Curry returns for follow-up of T1DM of   T1DM Denies missing medications. Morning sugars are 160-170 and are ranging 150 during the day. Denies hypoglycemic episodes. Acknowledges feeling polyuric/hyperactive bladder -- feeling the urge to urinate but with no production of urine. Occurring approximately 10-12 times/day. Denies dysphagia and feelings of gastroparesis. Denies neuropathy.    Basal Insulin: 40 units QD morning Meal Time Insulin: Carb Counting (sliding scale) but total daily does up to 70 units    Stabbing Pain in R Ear Stabbing pain in R ear since December with intermittent pain few times a day -- sharp pain at a scale of 6. No inciting factors or relievers -- "usually goes away on its own". Does feel like there is a popping sensation.    Bilateral Leg Pain Weight loss of 20 pounds since August 1.  Reports trying to do his best but the leg-pain and heart concerns are causing insomnia and causing lack of appetite making it hard to determine home much fast-acting insulin to take. Currently checking four-times a day.   Reports bilateral pain since June 19 dull aching but constant. Pain ranges from 1-10 and started at hip to feet but is now localized to ankles and feet. Acknowledges that lying prone helps relieve pain but does not acknowledge any other relieving agents. Pain is appreciated "whenever feet are on the ground". Pain is very localized to the bottom of the  feet.    Independent of the internal leg feels like "a cheese grater against skin" bilaterally and over all surfaces from "belly-button" down. No rash appreciated.   Denies recent trauma and recent infections. Denies sexual activity. Recently traveled to [place] but denies "doing any exploring" and denies going into caves. Cabin where he slept was old. Recently moved into his attic from the ground floor (2nd last week of April).    Other problems include:  Patient Active Problem List   Diagnosis Date Noted   Diabetes mellitus without complication ([place]) 57/11/7791   Elevated LFTs 12/20/2016   Mixed hyperlipidemia 12/20/2016   DKA (diabetic ketoacidoses) ([place]) 09/04/2016     His current medications include: Current Outpatient Prescriptions  Medication Sig Dispense Refill   diazepam (VALIUM) 5 MG tablet Take 1 tablet (5 mg total) by mouth every 8 (eight) hours as needed for muscle spasms. 12 tablet 0   ibuprofen (ADVIL,MOTRIN) 600 MG tablet Take 1 tablet (600 mg total) by mouth every 8 (eight) hours as needed. 30 tablet 0   insulin glargine (LANTUS) 100 UNIT/ML injection Inject 0.4 mLs (40 Units total) into the skin daily. 50 mL 1   insulin glulisine (APIDRA) 100 UNIT/ML injection Use before meals per insulin/carb ratio. Total daily dose up to 70 units 90 mL 11   No current facility-administered medications for this visit.      Exam:  BP 113/76 (BP Location: Left Arm)    Pulse (!) 109    Temp 98.5 F (36.9 C)  Ht _0  (1.778 m)    Wt 150 lb 1.6 oz (68.1 kg)    BMI 21.54 kg/m   Constitutional: Uncomfortable, flat affect, Alert, oriented, in NAD ENT: thyroid gland normal in size, smooth texture, nodules: absent Cardiovascular: Regular rhythm, no murmurs, gallops, rubs. Peripheral pulses 2+. Accelerated rate appreciated  Respiratory: Lungs clear, no wheezes or rales Skin: no rashes or lesions. Feet without lesions. Ingrown toenail with slight cut on R foot big toe.   Neurologic: Gait normal, DTRs normal.   Normal strength bilaterally for extension/flexion of  legs and plantarflexion and dorsal flexion. (strength appreciated 5/5)   light touch sensation normal in feet in 5/5 spots  bilaterally by monofilament  Sensation grossly intact  Psychiatric: Oriented, normal mood and affect Heme/lymph/immunologic: no lymphadenopathy   Recent labs:  Results for orders placed or performed in visit on 07/11/17  POCT Glucose (CBG)  Result Value Ref Range   POC Glucose 191 (A) 70 - 99 mg/dl

## 2017-07-11 NOTE — Progress Notes (Unsigned)
I saw this patient with the medical student and agree with the note as outlined.  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  Will check sed rate.  Needs to be referred to neurology.  Will do trial of neurontin.  RTC 1 month.

## 2017-07-18 ENCOUNTER — Other Ambulatory Visit: Payer: Self-pay

## 2017-07-18 DIAGNOSIS — M79604 Pain in right leg: Secondary | ICD-10-CM

## 2017-07-18 DIAGNOSIS — M79605 Pain in left leg: Principal | ICD-10-CM

## 2017-07-18 DIAGNOSIS — M79671 Pain in right foot: Principal | ICD-10-CM

## 2017-07-18 DIAGNOSIS — M79672 Pain in left foot: Principal | ICD-10-CM

## 2017-07-19 LAB — PROTIME-INR
INR: 1.1 (ref 0.8–1.2)
PROTHROMBIN TIME: 11.1 s (ref 9.1–12.0)

## 2017-07-19 LAB — SEDIMENTATION RATE: Sed Rate: 2 mm/hr (ref 0–15)

## 2017-07-25 ENCOUNTER — Ambulatory Visit: Payer: Self-pay | Admitting: Adult Health Nurse Practitioner

## 2017-07-25 VITALS — BP 119/88 | HR 99 | Temp 98.1°F | Wt 146.8 lb

## 2017-07-25 DIAGNOSIS — E109 Type 1 diabetes mellitus without complications: Secondary | ICD-10-CM

## 2017-07-25 DIAGNOSIS — M79605 Pain in left leg: Secondary | ICD-10-CM

## 2017-07-25 DIAGNOSIS — M79671 Pain in right foot: Secondary | ICD-10-CM

## 2017-07-25 DIAGNOSIS — M79672 Pain in left foot: Secondary | ICD-10-CM

## 2017-07-25 DIAGNOSIS — M79604 Pain in right leg: Secondary | ICD-10-CM

## 2017-07-25 LAB — GLUCOSE, POCT (MANUAL RESULT ENTRY): POC GLUCOSE: 292 mg/dL — AB (ref 70–99)

## 2017-07-25 MED ORDER — GABAPENTIN 300 MG PO CAPS: 300.0000 mg | ORAL_CAPSULE | Freq: Three times a day (TID) | ORAL | 3 refills | Status: AC

## 2017-07-25 NOTE — Progress Notes (Signed)
a1c

## 2017-07-25 NOTE — Progress Notes (Signed)
  Patient: Stephen Curry Male    DOB: 02/01/1996   21 y.o.   MRN: [id number] Visit Date: 07/25/2017  Today's Provider: Staci Acosta, NP   Chief Complaint  Patient presents with  . Follow-up   Subjective:    HPI  Recently seen in the ER for CP.  CXR negative. CP noted to be MSK  Seen at the Endo clinic for DM FU 2 weeks ago-  A1C obtained tonight.   Pt states that his legs continue to hurt.  ESR normal.  Recently started on gabapentin with minimal results.  Denies the gabapentin making him sleepy.     No Known Allergies Previous Medications   DIAZEPAM (VALIUM) 5 MG TABLET    Take 1 tablet (5 mg total) by mouth every 8 (eight) hours as needed for muscle spasms.   GABAPENTIN (NEURONTIN) 100 MG CAPSULE    Take 1 capsule (100 mg total) by mouth 3 (three) times daily.   IBUPROFEN (ADVIL,MOTRIN) 600 MG TABLET    Take 1 tablet (600 mg total) by mouth every 8 (eight) hours as needed.   INSULIN GLARGINE (LANTUS) 100 UNIT/ML INJECTION    Inject 0.4 mLs (40 Units total) into the skin daily.   INSULIN GLULISINE (APIDRA) 100 UNIT/ML INJECTION    Use before meals per insulin/carb ratio. Total daily dose up to 70 units    Review of Systems  All other systems reviewed and are negative.   Social History  Substance Use Topics  . Smoking status: Never Smoker  . Smokeless tobacco: Never Used  . Alcohol use No   Objective:   BP 119/88   Pulse 99   Temp 98.1 F (36.7 C)   Wt 146 lb 12.8 oz (66.6 kg)   BMI 21.06 kg/m   Physical Exam  Cardiovascular: Normal rate, regular rhythm, normal heart sounds and intact distal pulses.   Pulmonary/Chest: Effort normal and breath sounds normal.  Abdominal: Soft. Bowel sounds are normal.  Skin: Skin is warm and dry.  Vitals reviewed.       Assessment & Plan:         DM:  A1c tonight.   Encourage diabetic diet and exercise.  Continue current medication regimen. FU with Endo clinic as scheduled.   CP resolving.   Leg pain:   Increase gabapentin to '300mg'$  TID.  [facility].       Staci Acosta, NP   [facility]

## 2017-07-26 LAB — HEMOGLOBIN A1C
Est. average glucose Bld gHb Est-mCnc: 246 mg/dL
HEMOGLOBIN A1C: 10.2 % — AB (ref 4.8–5.6)

## 2017-07-28 ENCOUNTER — Telehealth: Payer: Self-pay

## 2017-07-28 NOTE — Telephone Encounter (Signed)
Called pt to go over results from doctor. No answer left msg for call back.

## 2017-07-28 NOTE — Telephone Encounter (Signed)
-----   Message from Jacelyn Pieah Doles-Johnson, NP sent at 07/27/2017  7:54 PM EDT ----- A1c is down to 10.2 from 10.6 please have patient increase Labtus from 40 units daily to 45 units.

## 2017-08-03 ENCOUNTER — Telehealth: Payer: Self-pay

## 2017-08-03 NOTE — Telephone Encounter (Signed)
-----   Message from Teah Doles-Johnson, NP sent at 07/27/2017  7:54 PM EDT ----- A1c is down to 10.2 from 10.6 please have patient increase Labtus from 40 units daily to 45 units. 

## 2017-08-03 NOTE — Telephone Encounter (Signed)
Called pt and went over results from doctor. PT verbalized understanding.

## 2017-09-05 ENCOUNTER — Ambulatory Visit: Payer: Self-pay

## 2017-09-26 ENCOUNTER — Ambulatory Visit: Payer: Self-pay

## 2017-11-06 ENCOUNTER — Encounter: Payer: Self-pay | Admitting: Endocrinology

## 2017-12-01 ENCOUNTER — Telehealth: Payer: Self-pay | Admitting: Pharmacist

## 2017-12-01 NOTE — Telephone Encounter (Signed)
12/01/17 Faxed Novo Nordisk refill request for Levemir Vials-Inject 40 units under the skin daily #5 & Novolog Vials-Inject units per insulin/carb ratio under the skin before meals # 9 (Max daily dose 70 units).Forde RadonAJ

## 2017-12-20 IMAGING — US US EXTREM LOW VENOUS BILAT
1 series · 13 of 24 positions shown · non-contrast
Comparison: None in PACs

CLINICAL DATA: Bilateral lower extremity swelling and pain for the
past month



[Series 1: us extrem low venous bilat · 0.05mm/px · 13 of 61 slices shown]
[im 1/61]
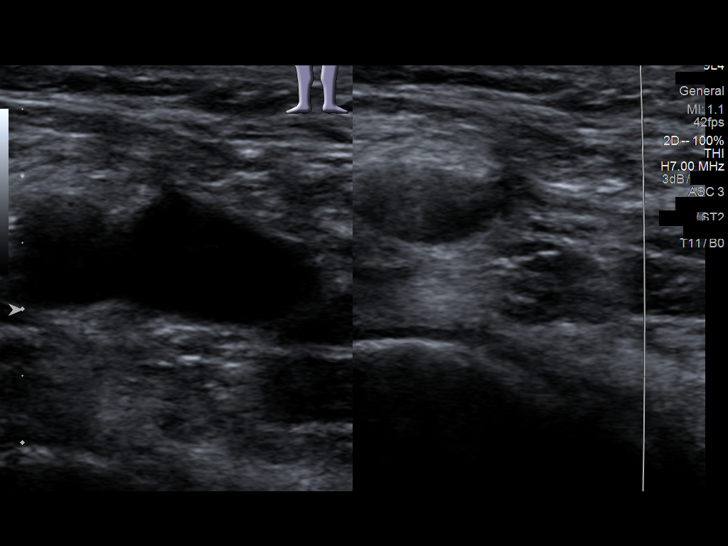
[im 6/61]
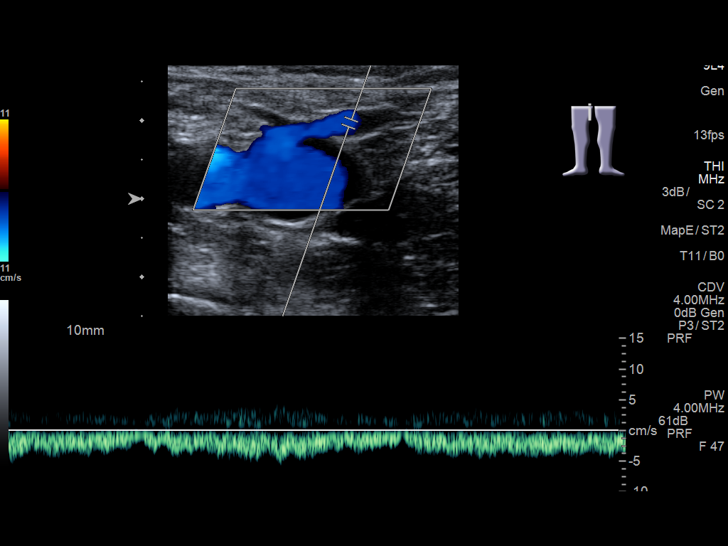
[im 11/61]
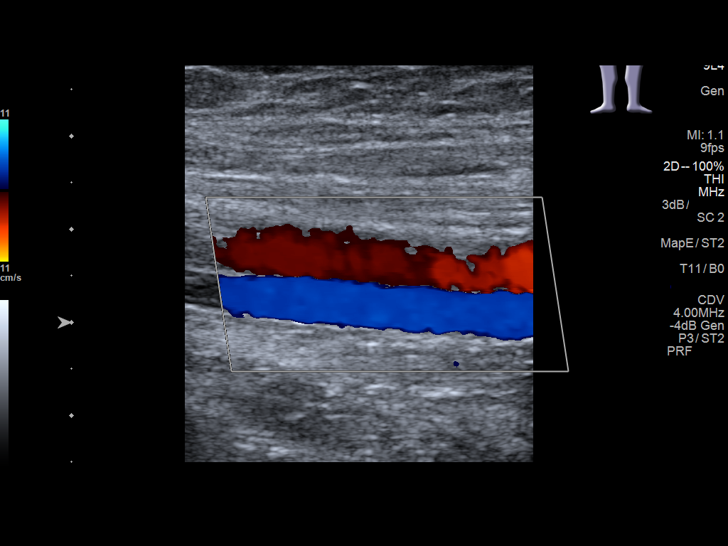
[im 16/61]
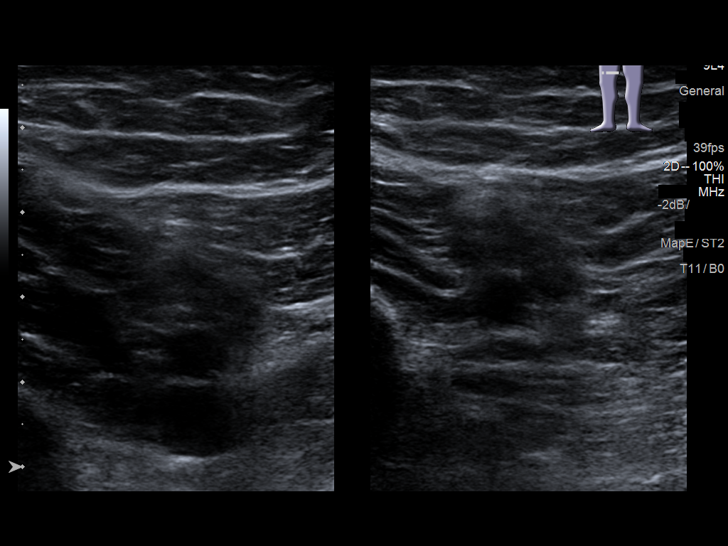
[im 21/61]
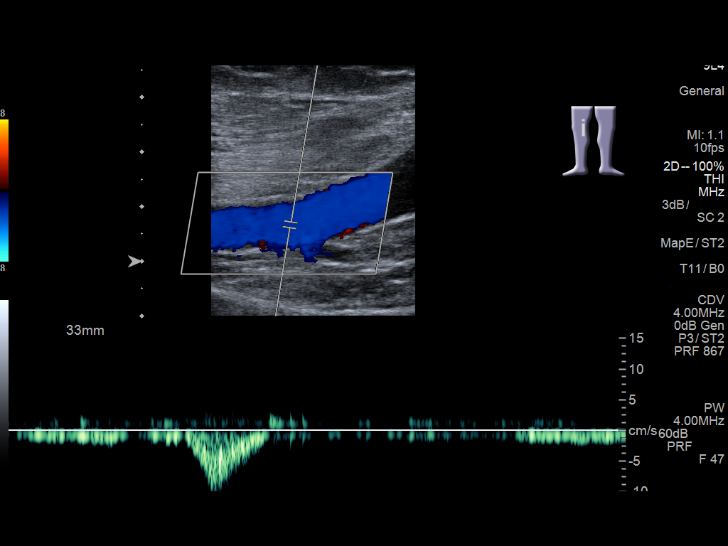
[im 27/61]
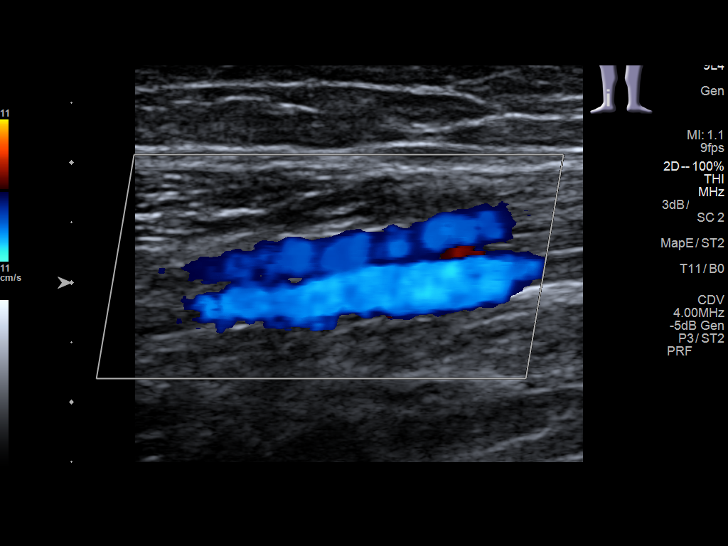
[im 32/61]
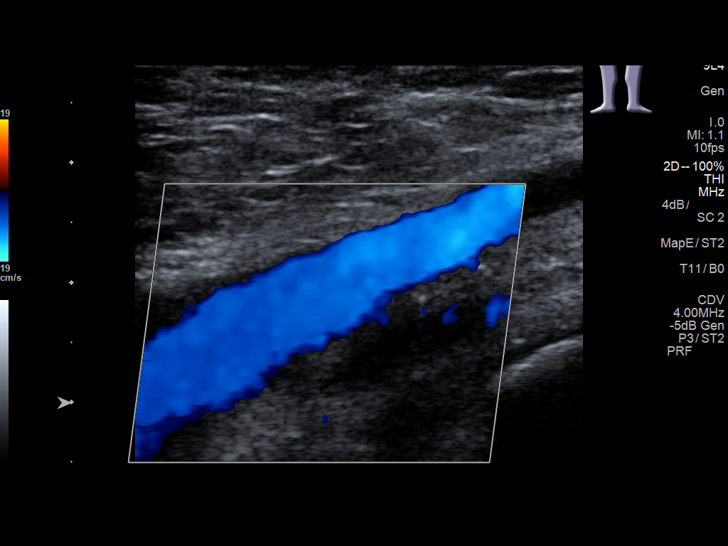
[im 34/61]
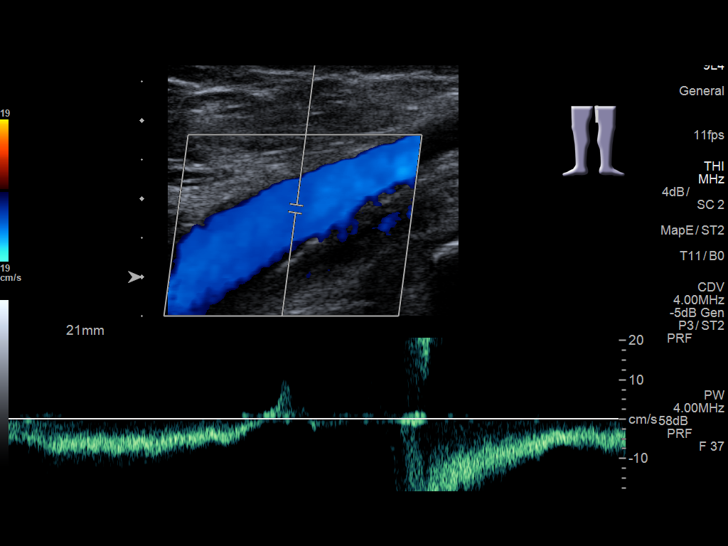
[im 40/61]
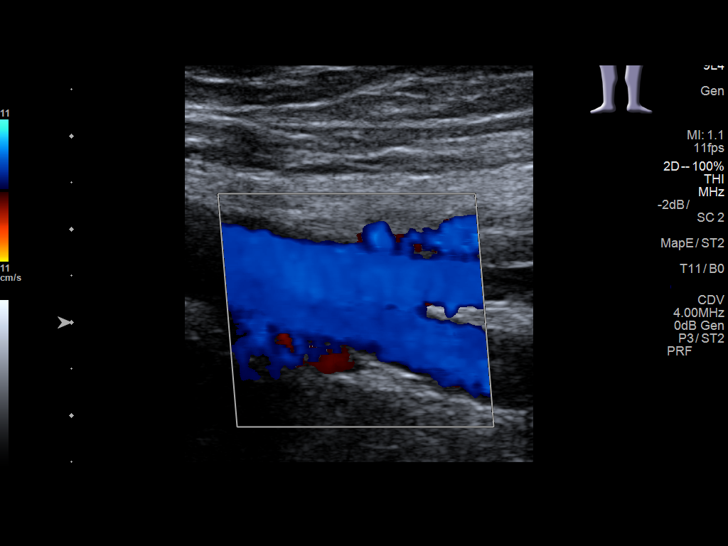
[im 45/61]
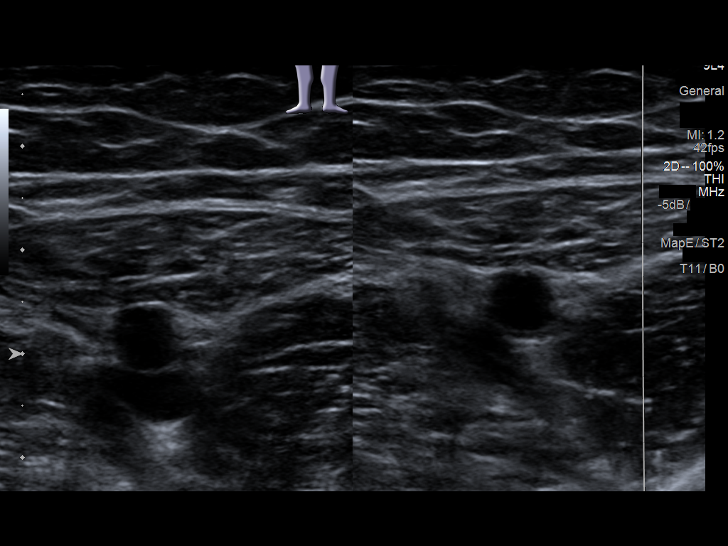
[im 50/61]
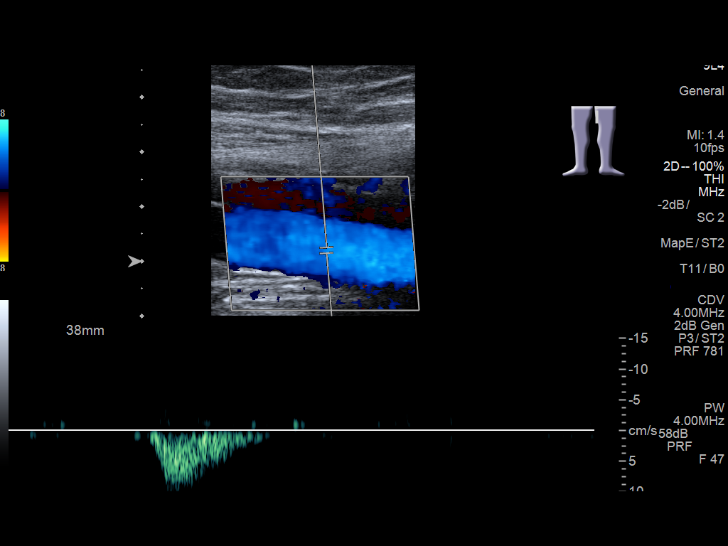
[im 55/61]
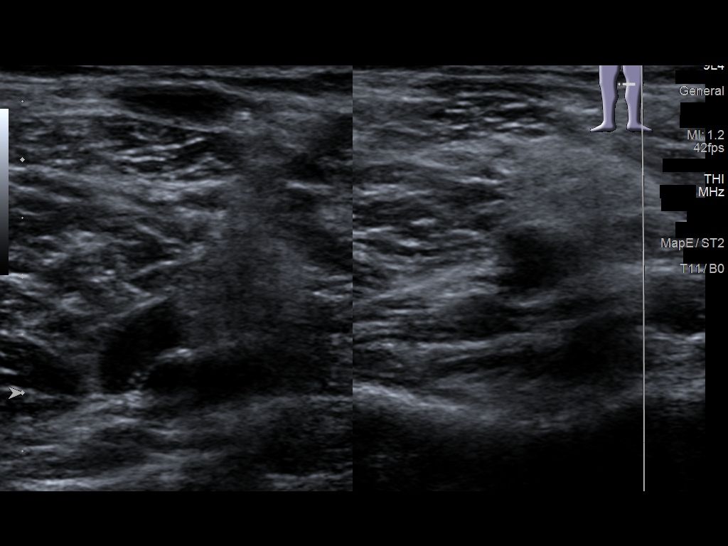
[im 61/61]
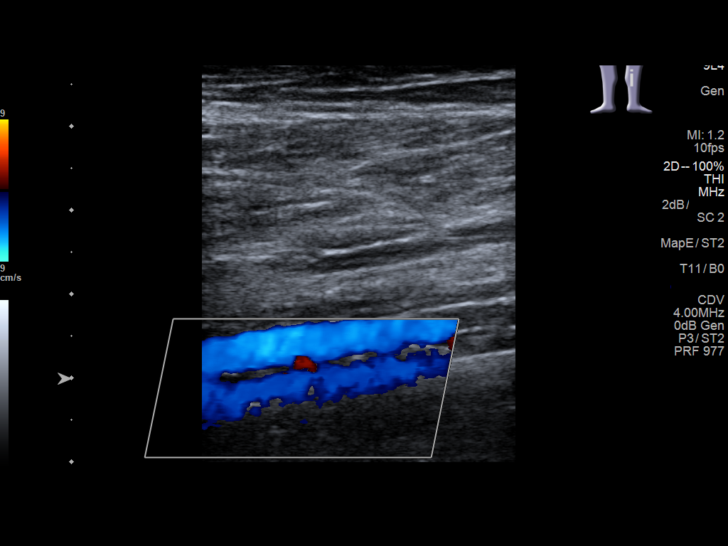

[13 of 24 positions shown; findings below may reference images not displayed]

FINDINGS: RIGHT LOWER EXTREMITY

Common Femoral Vein: No evidence of thrombus. Normal
compressibility, respiratory phasicity and response to augmentation.

Saphenofemoral Junction: No evidence of thrombus. Normal
compressibility and flow on color Doppler imaging.

Profunda Femoral Vein: No evidence of thrombus. Normal
compressibility and flow on color Doppler imaging.

Femoral Vein: No evidence of thrombus. Normal compressibility,
respiratory phasicity and response to augmentation.

Popliteal Vein: No evidence of thrombus. Normal compressibility,
respiratory phasicity and response to augmentation.

Calf Veins: No evidence of thrombus. Normal compressibility and flow
on color Doppler imaging.

Superficial Great Saphenous Vein: No evidence of thrombus. Normal
compressibility and flow on color Doppler imaging.

Venous Reflux:  None.

Other Findings:  None.

LEFT LOWER EXTREMITY

Common Femoral Vein: No evidence of thrombus. Normal
compressibility, respiratory phasicity and response to augmentation.

Saphenofemoral Junction: No evidence of thrombus. Normal
compressibility and flow on color Doppler imaging.

Profunda Femoral Vein: No evidence of thrombus. Normal
compressibility and flow on color Doppler imaging.

Femoral Vein: No evidence of thrombus. Normal compressibility,
respiratory phasicity and response to augmentation.

Popliteal Vein: No evidence of thrombus. Normal compressibility,
respiratory phasicity and response to augmentation.

Calf Veins: No evidence of thrombus. Normal compressibility and flow
on color Doppler imaging.

Superficial Great Saphenous Vein: No evidence of thrombus. Normal
compressibility and flow on color Doppler imaging.

Venous Reflux:  None.

Other Findings:  None.
IMPRESSION: No evidence of DVT within either lower extremity.

## 2018-09-13 ENCOUNTER — Telehealth: Payer: Self-pay | Admitting: Pharmacy Technician

## 2018-09-13 NOTE — Telephone Encounter (Signed)
Patient failed to provide 2019 poi.  No additional medication assistance will be provided by [facility] without the required proof of income documentation.  Patient notified by letter.  Aadam Zhen J. Bertrum Helmstetter Care Manager [facility]
